# Patient Record
Sex: Female | Born: 1990
Health system: Southern US, Community
[De-identification: ages and names within clinical notes are randomized; demographics above are authoritative.]

## PROBLEM LIST (undated history)

## (undated) ENCOUNTER — Inpatient Hospital Stay (HOSPITAL_COMMUNITY): Payer: Self-pay

## (undated) DIAGNOSIS — F1210 Cannabis abuse, uncomplicated: Secondary | ICD-10-CM

## (undated) DIAGNOSIS — R1110 Vomiting, unspecified: Secondary | ICD-10-CM

## (undated) HISTORY — PX: INDUCED ABORTION: SHX677

---

## 2010-06-25 ENCOUNTER — Emergency Department (HOSPITAL_BASED_OUTPATIENT_CLINIC_OR_DEPARTMENT_OTHER): Admission: EM | Admit: 2010-06-25 | Discharge: 2010-06-25 | Payer: Self-pay | Admitting: Emergency Medicine

## 2011-03-07 LAB — WET PREP, GENITAL: Yeast Wet Prep HPF POC: NONE SEEN

## 2011-03-07 LAB — URINALYSIS, ROUTINE W REFLEX MICROSCOPIC
Ketones, ur: NEGATIVE mg/dL
Nitrite: NEGATIVE
Protein, ur: NEGATIVE mg/dL
Urobilinogen, UA: 0.2 mg/dL (ref 0.0–1.0)
pH: 6.5 (ref 5.0–8.0)

## 2011-03-07 LAB — URINE MICROSCOPIC-ADD ON

## 2011-03-07 LAB — PREGNANCY, URINE: Preg Test, Ur: NEGATIVE

## 2011-03-07 LAB — RPR: RPR Ser Ql: NONREACTIVE

## 2011-03-07 LAB — GC/CHLAMYDIA PROBE AMP, GENITAL: Chlamydia, DNA Probe: POSITIVE — AB

## 2012-10-02 ENCOUNTER — Encounter (HOSPITAL_BASED_OUTPATIENT_CLINIC_OR_DEPARTMENT_OTHER): Payer: Self-pay | Admitting: *Deleted

## 2012-10-02 ENCOUNTER — Emergency Department (HOSPITAL_BASED_OUTPATIENT_CLINIC_OR_DEPARTMENT_OTHER)
Admission: EM | Admit: 2012-10-02 | Discharge: 2012-10-02 | Disposition: A | Discharge status: Home / Self Care | Payer: Medicaid Other | Attending: Emergency Medicine | Admitting: Emergency Medicine

## 2012-10-02 DIAGNOSIS — R10819 Abdominal tenderness, unspecified site: Secondary | ICD-10-CM | POA: Insufficient documentation

## 2012-10-02 DIAGNOSIS — R109 Unspecified abdominal pain: Secondary | ICD-10-CM | POA: Insufficient documentation

## 2012-10-02 DIAGNOSIS — O2390 Unspecified genitourinary tract infection in pregnancy, unspecified trimester: Secondary | ICD-10-CM | POA: Insufficient documentation

## 2012-10-02 DIAGNOSIS — N390 Urinary tract infection, site not specified: Secondary | ICD-10-CM | POA: Insufficient documentation

## 2012-10-02 DIAGNOSIS — O210 Mild hyperemesis gravidarum: Secondary | ICD-10-CM

## 2012-10-02 LAB — URINE MICROSCOPIC-ADD ON

## 2012-10-02 LAB — CBC WITH DIFFERENTIAL/PLATELET
Basophils Absolute: 0 10*3/uL (ref 0.0–0.1)
Eosinophils Relative: 0 % (ref 0–5)
HCT: 42.9 % (ref 36.0–46.0)
Lymphs Abs: 2.4 10*3/uL (ref 0.7–4.0)
MCH: 26.9 pg (ref 26.0–34.0)
MCV: 74.9 fL — ABNORMAL LOW (ref 78.0–100.0)
Monocytes Absolute: 0.6 10*3/uL (ref 0.1–1.0)
Monocytes Relative: 5 % (ref 3–12)
Neutro Abs: 9 10*3/uL — ABNORMAL HIGH (ref 1.7–7.7)
Platelets: 322 10*3/uL (ref 150–400)
RDW: 13.3 % (ref 11.5–15.5)

## 2012-10-02 LAB — BASIC METABOLIC PANEL
BUN: 9 mg/dL (ref 6–23)
CO2: 21 mEq/L (ref 19–32)
Calcium: 10.3 mg/dL (ref 8.4–10.5)
Chloride: 101 mEq/L (ref 96–112)
Creatinine, Ser: 0.6 mg/dL (ref 0.50–1.10)

## 2012-10-02 LAB — URINALYSIS, ROUTINE W REFLEX MICROSCOPIC
Glucose, UA: NEGATIVE mg/dL
Glucose, UA: NEGATIVE mg/dL
Hgb urine dipstick: NEGATIVE
Hgb urine dipstick: NEGATIVE
Ketones, ur: >80 mg/dL — AB
Specific Gravity, Urine: 1.039 — ABNORMAL HIGH (ref 1.005–1.030)
Specific Gravity, Urine: 1.038 — ABNORMAL HIGH (ref 1.005–1.030)
pH: 5.5 (ref 5.0–8.0)
pH: 5.5 (ref 5.0–8.0)

## 2012-10-02 LAB — HCG, QUANTITATIVE, PREGNANCY: hCG, Beta Chain, Quant, S: 102575 m[IU]/mL — ABNORMAL HIGH (ref <5)

## 2012-10-02 LAB — PREGNANCY, URINE: Preg Test, Ur: POSITIVE — AB

## 2012-10-02 MED ORDER — METOCLOPRAMIDE HCL 10 MG PO TABS: 10.0000 mg | ORAL_TABLET | Freq: Four times a day (QID) | ORAL | Status: DC | PRN

## 2012-10-02 MED ORDER — CEPHALEXIN 500 MG PO CAPS: 500.0000 mg | ORAL_CAPSULE | Freq: Three times a day (TID) | ORAL | Status: DC

## 2012-10-02 MED ORDER — CEPHALEXIN 250 MG PO CAPS
500.0000 mg | ORAL_CAPSULE | Freq: Once | ORAL | Status: AC
  Administered 2012-10-02: 500 mg via ORAL
  Filled 2012-10-02: qty 2

## 2012-10-02 MED ORDER — ONDANSETRON HCL 4 MG/2ML IJ SOLN
4.0000 mg | Freq: Once | INTRAMUSCULAR | Status: AC
  Administered 2012-10-02: 4 mg via INTRAVENOUS
  Filled 2012-10-02: qty 2

## 2012-10-02 MED ORDER — DEXTROSE 5 % AND 0.9 % NACL IV BOLUS
1000.0000 mL | Freq: Once | INTRAVENOUS | Status: AC
  Administered 2012-10-02: 1000 mL via INTRAVENOUS

## 2012-10-02 MED ORDER — MORPHINE SULFATE 4 MG/ML IJ SOLN
4.0000 mg | Freq: Once | INTRAMUSCULAR | Status: AC
  Administered 2012-10-02: 4 mg via INTRAVENOUS
  Filled 2012-10-02: qty 1

## 2012-10-02 MED ORDER — DEXTROSE-NACL 5-0.9 % IV SOLN
INTRAVENOUS | Status: DC
  Administered 2012-10-02: 12:00:00 via INTRAVENOUS

## 2012-10-02 NOTE — ED Notes (Signed)
Patient states she developed nausea last tues and has progressed to back pain, crampy abdominal pain, sob, n & v, and weakness.  States this morning she had blood in her vomit and urine.

## 2012-10-02 NOTE — ED Provider Notes (Signed)
History     CSN: [id number]  Arrival date & time 10/02/12  0902   First MD Initiated Contact with Patient 10/02/12 [phone]      Chief Complaint  Patient presents with  . Hematuria  . Abdominal Pain    (Consider location/radiation/quality/duration/timing/severity/associated sxs/prior treatment) Patient is a 21 y.o. female presenting with hematuria and abdominal pain. The history is provided by the patient.  Hematuria Associated symptoms include abdominal pain.  Abdominal Pain The primary symptoms of the illness include abdominal pain.  Additional symptoms associated with the illness include hematuria.  She had onset about 5 days ago of crampy suprapubic pain, nausea, vomiting. Suprapubic pain that radiates to both flanks. She is also having some mild to moderate epigastric pain. She's feeling generally weak. She started having blood in her urine yesterday. She denies fever, chills, sweats. Last menstrual period was September 7. She is gravida 2 para 1 with one voluntary interruption of pregnancy.  Past Medical History  Diagnosis Date  . Preeclampsia complicating hypertension     with CHF pregnancy related, resolved.    Past Surgical History  Procedure Date  . Cesarean section     No family history on file.  History  Substance Use Topics  . Smoking status: Former Smoker    Types: Cigarettes    Quit date: 09/18/2012  . Smokeless tobacco: Not on file  . Alcohol Use: No    OB History    Grav Para Term Preterm Abortions TAB SAB Ect Mult Living                  Review of Systems  Gastrointestinal: Positive for abdominal pain.  Genitourinary: Positive for hematuria.  All other systems reviewed and are negative.    Allergies  Review of patient's allergies indicates no known allergies.  Home Medications  No current outpatient prescriptions on file.  BP 125/85  Pulse 91  Temp 98.7 F (37.1 C) (Oral)  Resp 18  Ht 5\' 5"  (1.651 m)  Wt 175 lb (79.379 kg)  BMI  29.12 kg/m2  SpO2 100%  LMP 08/26/2012  Physical Exam  Nursing note and vitals reviewed. 21year old female, resting comfortably and in no acute distress. Vital signs are normal. Oxygen saturation is 100%, which is normal. Head is normocephalic and atraumatic. PERRLA, EOMI. Oropharynx is clear. Neck is nontender and supple without adenopathy or JVD. Back is nontender and there is no CVA tenderness. Lungs are clear without rales, wheezes, or rhonchi. Chest is nontender. Heart has regular rate and rhythm without murmur. Abdomen is soft, flat, without masses or hepatosplenomegaly and peristalsis is hypoactive. There is mild suprapubic tenderness and mild epigastric tenderness. There is no rebound or guarding. Extremities have no cyanosis or edema, full range of motion is present. Skin is warm and dry without rash. Neurologic: Mental status is normal, cranial nerves are intact, there are no motor or sensory deficits.   ED Course  Procedures (including critical care time)  Results for orders placed during the hospital encounter of 10/02/12  URINALYSIS, ROUTINE W REFLEX MICROSCOPIC      Component Value Range   Color, Urine ORANGE (*) YELLOW   APPearance TURBID (*) CLEAR   Specific Gravity, Urine 1.039 (*) 1.005 - 1.030   pH 5.5  5.0 - 8.0   Glucose, UA NEGATIVE  NEGATIVE mg/dL   Hgb urine dipstick NEGATIVE  NEGATIVE   Bilirubin Urine MODERATE (*) NEGATIVE   Ketones, ur >80 (*) NEGATIVE mg/dL   Protein, ur  100 (*) NEGATIVE mg/dL   Urobilinogen, UA 1.0  0.0 - 1.0 mg/dL   Nitrite NEGATIVE  NEGATIVE   Leukocytes, UA SMALL (*) NEGATIVE  PREGNANCY, URINE      Component Value Range   Preg Test, Ur POSITIVE (*) NEGATIVE  URINE MICROSCOPIC-ADD ON      Component Value Range   Squamous Epithelial / LPF MANY (*) RARE   WBC, UA 11-20  <3 WBC/hpf   RBC / HPF 0-2  <3 RBC/hpf   Bacteria, UA MANY (*) RARE   Casts GRANULAR CAST (*) NEGATIVE   Urine-Other MUCOUS PRESENT    CBC WITH DIFFERENTIAL       Component Value Range   WBC 12.0 (*) 4.0 - 10.5 K/uL   RBC 5.73 (*) 3.87 - 5.11 MIL/uL   Hemoglobin 15.4 (*) 12.0 - 15.0 g/dL   HCT 16.1  09.6 - 04.5 %   MCV 74.9 (*) 78.0 - 100.0 fL   MCH 26.9  26.0 - 34.0 pg   MCHC 35.9  30.0 - 36.0 g/dL   RDW 40.9  81.1 - 91.4 %   Platelets 322  150 - 400 K/uL   Neutrophils Relative 75  43 - 77 %   Lymphocytes Relative 20  12 - 46 %   Monocytes Relative 5  3 - 12 %   Eosinophils Relative 0  0 - 5 %   Basophils Relative 0  0 - 1 %   Neutro Abs 9.0 (*) 1.7 - 7.7 K/uL   Lymphs Abs 2.4  0.7 - 4.0 K/uL   Monocytes Absolute 0.6  0.1 - 1.0 K/uL   Eosinophils Absolute 0.0  0.0 - 0.7 K/uL   Basophils Absolute 0.0  0.0 - 0.1 K/uL   Smear Review MORPHOLOGY UNREMARKABLE    BASIC METABOLIC PANEL      Component Value Range   Sodium 138  135 - 145 mEq/L   Potassium 3.5  3.5 - 5.1 mEq/L   Chloride 101  96 - 112 mEq/L   CO2 21  19 - 32 mEq/L   Glucose, Bld 84  70 - 99 mg/dL   BUN 9  6 - 23 mg/dL   Creatinine, Ser 7.82  0.50 - 1.10 mg/dL   Calcium 95.6  8.4 - 21.3 mg/dL   GFR calc non Af Amer >90  >90 mL/min   GFR calc Af Amer >90  >90 mL/min  HCG, QUANTITATIVE, PREGNANCY      Component Value Range   hCG, Beta Chain, Quant, S 102575 (*) <5 mIU/mL  URINALYSIS, ROUTINE W REFLEX MICROSCOPIC      Component Value Range   Color, Urine ORANGE (*) YELLOW   APPearance CLOUDY (*) CLEAR   Specific Gravity, Urine 1.038 (*) 1.005 - 1.030   pH 5.5  5.0 - 8.0   Glucose, UA NEGATIVE  NEGATIVE mg/dL   Hgb urine dipstick NEGATIVE  NEGATIVE   Bilirubin Urine MODERATE (*) NEGATIVE   Ketones, ur >80 (*) NEGATIVE mg/dL   Protein, ur 30 (*) NEGATIVE mg/dL   Urobilinogen, UA 1.0  0.0 - 1.0 mg/dL   Nitrite NEGATIVE  NEGATIVE   Leukocytes, UA SMALL (*) NEGATIVE  URINE MICROSCOPIC-ADD ON      Component Value Range   Squamous Epithelial / LPF RARE  RARE   WBC, UA 3-6  <3 WBC/hpf   RBC / HPF 3-6  <3 RBC/hpf   Bacteria, UA FEW (*) RARE   Urine-Other MUCOUS PRESENT  1. Hyperemesis gravidarum   2. Urinary tract infection       MDM  Pregnancy test is come back positive, so most symptoms may be related to her pregnancy. Urinalysis shows ketonuria on, so she will be given IV fluid with dextrose and saline. Ondansetron (nausea. The urine sample is contaminated with many squamous epithelial cells, so a catheterized urine will be obtained. Note is made of bacteria and granular casts which is strongly indicative of urinary tract infection this will need to be clarified with a catheterized specimen.  Catheterized urine still shows bacteria although no mention of casts. Urine is sent for culture of and she is started on cephalexin for urinary tract infection. She feels much better after IV hydration and IV ondansetron. She is sent home with prescriptions for cephalexin and metoclopramide. She is instructed to make arrangements for prenatal care.        Dione Booze, MD 10/02/12 1245

## 2012-10-04 LAB — URINE CULTURE: Culture: NO GROWTH

## 2012-12-19 ENCOUNTER — Emergency Department (HOSPITAL_BASED_OUTPATIENT_CLINIC_OR_DEPARTMENT_OTHER)
Admission: EM | Admit: 2012-12-19 | Discharge: 2012-12-19 | Disposition: A | Discharge status: Home / Self Care | Payer: Medicaid Other | Attending: Emergency Medicine | Admitting: Emergency Medicine

## 2012-12-19 ENCOUNTER — Encounter (HOSPITAL_BASED_OUTPATIENT_CLINIC_OR_DEPARTMENT_OTHER): Payer: Self-pay

## 2012-12-19 DIAGNOSIS — O99340 Other mental disorders complicating pregnancy, unspecified trimester: Secondary | ICD-10-CM | POA: Insufficient documentation

## 2012-12-19 DIAGNOSIS — F1210 Cannabis abuse, uncomplicated: Secondary | ICD-10-CM | POA: Insufficient documentation

## 2012-12-19 DIAGNOSIS — K920 Hematemesis: Secondary | ICD-10-CM | POA: Insufficient documentation

## 2012-12-19 DIAGNOSIS — O99891 Other specified diseases and conditions complicating pregnancy: Secondary | ICD-10-CM | POA: Insufficient documentation

## 2012-12-19 DIAGNOSIS — R109 Unspecified abdominal pain: Secondary | ICD-10-CM | POA: Insufficient documentation

## 2012-12-19 DIAGNOSIS — Z87891 Personal history of nicotine dependence: Secondary | ICD-10-CM | POA: Insufficient documentation

## 2012-12-19 DIAGNOSIS — O219 Vomiting of pregnancy, unspecified: Secondary | ICD-10-CM

## 2012-12-19 DIAGNOSIS — B9689 Other specified bacterial agents as the cause of diseases classified elsewhere: Secondary | ICD-10-CM

## 2012-12-19 DIAGNOSIS — N898 Other specified noninflammatory disorders of vagina: Secondary | ICD-10-CM | POA: Insufficient documentation

## 2012-12-19 DIAGNOSIS — N760 Acute vaginitis: Secondary | ICD-10-CM

## 2012-12-19 LAB — URINALYSIS, ROUTINE W REFLEX MICROSCOPIC
Bilirubin Urine: NEGATIVE
Leukocytes, UA: NEGATIVE
Nitrite: NEGATIVE
Specific Gravity, Urine: 1.018 (ref 1.005–1.030)
pH: 8 (ref 5.0–8.0)

## 2012-12-19 LAB — CBC WITH DIFFERENTIAL/PLATELET
Eosinophils Absolute: 0.1 10*3/uL (ref 0.0–0.7)
HCT: 33 % — ABNORMAL LOW (ref 36.0–46.0)
Hemoglobin: 11.4 g/dL — ABNORMAL LOW (ref 12.0–15.0)
Lymphs Abs: 2.3 10*3/uL (ref 0.7–4.0)
MCH: 27.3 pg (ref 26.0–34.0)
MCHC: 34.5 g/dL (ref 30.0–36.0)
MCV: 78.9 fL (ref 78.0–100.0)
Monocytes Absolute: 0.6 10*3/uL (ref 0.1–1.0)
Monocytes Relative: 5 % (ref 3–12)
Neutrophils Relative %: 72 % (ref 43–77)
RBC: 4.18 MIL/uL (ref 3.87–5.11)

## 2012-12-19 LAB — BASIC METABOLIC PANEL
BUN: 3 mg/dL — ABNORMAL LOW (ref 6–23)
Creatinine, Ser: 0.4 mg/dL — ABNORMAL LOW (ref 0.50–1.10)
GFR calc non Af Amer: >90 mL/min (ref >90)
Glucose, Bld: 83 mg/dL (ref 70–99)
Potassium: 3.5 mEq/L (ref 3.5–5.1)

## 2012-12-19 LAB — WET PREP, GENITAL: Yeast Wet Prep HPF POC: NONE SEEN

## 2012-12-19 MED ORDER — ONDANSETRON 8 MG PO TBDP: 8.0000 mg | ORAL_TABLET | Freq: Three times a day (TID) | ORAL | Status: DC | PRN

## 2012-12-19 MED ORDER — SODIUM CHLORIDE 0.9 % IV BOLUS (SEPSIS)
1000.0000 mL | Freq: Once | INTRAVENOUS | Status: AC
  Administered 2012-12-19: 1000 mL via INTRAVENOUS

## 2012-12-19 MED ORDER — METRONIDAZOLE 500 MG PO TABS: 500.0000 mg | ORAL_TABLET | Freq: Two times a day (BID) | ORAL | Status: DC

## 2012-12-19 NOTE — ED Provider Notes (Addendum)
History     CSN: [id number]  Arrival date & time 12/19/12  9562   First MD Initiated Contact with Patient 12/19/12 [phone]      Chief Complaint  Patient presents with  . Abdominal Cramping  . Emesis During Pregnancy  . Hematemesis    (Consider location/radiation/quality/duration/timing/severity/associated sxs/prior treatment) HPI g12p1 lmp 08/24/2012, edc 5/21 here with vomiting.  Patient states she has vomiting since she became pregnant with average of 3-4 times per day.  Patient states she has lost weight with pregnancy.  States she was seen here once.  Dr. Shawnie Pons is ob at [facility].  States was smoking marijuana for nausea but quit two weeks ago.    Past Medical History  Diagnosis Date  . Preeclampsia complicating hypertension     with CHF pregnancy related, resolved.    Past Surgical History  Procedure Date  . Cesarean section     No family history on file.  History  Substance Use Topics  . Smoking status: Former Smoker    Types: Cigarettes    Quit date: 09/18/2012  . Smokeless tobacco: Not on file  . Alcohol Use: No    OB History    Grav Para Term Preterm Abortions TAB SAB Ect Mult Living   1               Review of Systems  Genitourinary: Positive for vaginal discharge. Negative for dysuria, difficulty urinating and dyspareunia.  All other systems reviewed and are negative.    Allergies  Review of patient's allergies indicates no known allergies.  Home Medications  No current outpatient prescriptions on file.  BP 123/64  Pulse 70  Temp 98.5 F (36.9 C) (Oral)  Resp 18  SpO2 100%  LMP 08/24/2012  Physical Exam  Vitals reviewed. Constitutional: She is oriented to person, place, and time. She appears well-developed and well-nourished.  HENT:  Head: Normocephalic and atraumatic.  Eyes: Conjunctivae normal and EOM are normal. Pupils are equal, round, and reactive to light.  Neck: Normal range of motion. Neck supple.  Cardiovascular: Normal rate,  regular rhythm and normal heart sounds.   Pulmonary/Chest: Effort normal and breath sounds normal.  Abdominal: Soft. Bowel sounds are normal. She exhibits no distension. There is no tenderness.  Genitourinary: Uterus normal. Vaginal discharge found.       Uterus enlarge c.w. Dates, mild discharge   Musculoskeletal: Normal range of motion.  Neurological: She is alert and oriented to person, place, and time. She has normal reflexes.  Skin: Skin is warm and dry.  Psychiatric: She has a normal mood and affect. Her behavior is normal. Judgment and thought content normal.    ED Course  Procedures (including critical care time)  Labs Reviewed - No data to display No results found.   No diagnosis found.  Results for orders placed during the hospital encounter of 12/19/12  CBC WITH DIFFERENTIAL      Component Value Range   WBC 10.7 (*) 4.0 - 10.5 K/uL   RBC 4.18  3.87 - 5.11 MIL/uL   Hemoglobin 11.4 (*) 12.0 - 15.0 g/dL   HCT 65.7 (*) 84.6 - 96.2 %   MCV 78.9  78.0 - 100.0 fL   MCH 27.3  26.0 - 34.0 pg   MCHC 34.5  30.0 - 36.0 g/dL   RDW 95.2  84.1 - 32.4 %   Platelets 250  150 - 400 K/uL   Neutrophils Relative 72  43 - 77 %   Neutro Abs 7.8 (*)  1.7 - 7.7 K/uL   Lymphocytes Relative 22  12 - 46 %   Lymphs Abs 2.3  0.7 - 4.0 K/uL   Monocytes Relative 5  3 - 12 %   Monocytes Absolute 0.6  0.1 - 1.0 K/uL   Eosinophils Relative 1  0 - 5 %   Eosinophils Absolute 0.1  0.0 - 0.7 K/uL   Basophils Relative 0  0 - 1 %   Basophils Absolute 0.0  0.0 - 0.1 K/uL  BASIC METABOLIC PANEL      Component Value Range   Sodium 138  135 - 145 mEq/L   Potassium 3.5  3.5 - 5.1 mEq/L   Chloride 106  96 - 112 mEq/L   CO2 21  19 - 32 mEq/L   Glucose, Bld 83  70 - 99 mg/dL   BUN 3 (*) 6 - 23 mg/dL   Creatinine, Ser 1.61 (*) 0.50 - 1.10 mg/dL   Calcium 8.9  8.4 - 09.6 mg/dL   GFR calc non Af Amer >90  >90 mL/min   GFR calc Af Amer >90  >90 mL/min  URINALYSIS, ROUTINE W REFLEX MICROSCOPIC       Component Value Range   Color, Urine YELLOW  YELLOW   APPearance CLOUDY (*) CLEAR   Specific Gravity, Urine 1.018  1.005 - 1.030   pH 8.0  5.0 - 8.0   Glucose, UA NEGATIVE  NEGATIVE mg/dL   Hgb urine dipstick NEGATIVE  NEGATIVE   Bilirubin Urine NEGATIVE  NEGATIVE   Ketones, ur NEGATIVE  NEGATIVE mg/dL   Protein, ur NEGATIVE  NEGATIVE mg/dL   Urobilinogen, UA 0.2  0.0 - 1.0 mg/dL   Nitrite NEGATIVE  NEGATIVE   Leukocytes, UA NEGATIVE  NEGATIVE  WET PREP, GENITAL      Component Value Range   Yeast Wet Prep HPF POC NONE SEEN  NONE SEEN   Trich, Wet Prep NONE SEEN  NONE SEEN   Clue Cells Wet Prep HPF POC TOO NUMEROUS TO COUNT (*) NONE SEEN   WBC, Wet Prep HPF POC FEW (*) NONE SEEN     MDM  Workup consistent with bacterial vaginosis and will treat. Given IV fluids here and is mandatory he has had no vomiting.        Hilario Quarry, MD 12/19/12 0454  Hilario Quarry, MD 12/21/12 [phone]

## 2012-12-19 NOTE — ED Notes (Signed)
Pt reports vomiting dark red blood this am and abdominal cramping.

## 2012-12-21 LAB — GC/CHLAMYDIA PROBE AMP: GC Probe RNA: NEGATIVE

## 2012-12-22 NOTE — ED Notes (Signed)
+   chlamydia Chart sent to EDP office for review. 

## 2012-12-23 NOTE — ED Notes (Signed)
Chart returned from EDP office. Prescribed Azithromycin 500 mg. 1 gram orally x 1 dose. # 2. No refills. Prescribed by Arthor Captain PA-C.

## 2012-12-23 NOTE — ED Notes (Signed)
Rx called in to [facility] on [address] in [place] by [employer] [facility].

## 2013-06-26 ENCOUNTER — Emergency Department (HOSPITAL_BASED_OUTPATIENT_CLINIC_OR_DEPARTMENT_OTHER)
Admission: EM | Admit: 2013-06-26 | Discharge: 2013-06-26 | Disposition: A | Discharge status: Home / Self Care | Payer: Medicaid Other | Attending: Emergency Medicine | Admitting: Emergency Medicine

## 2013-06-26 ENCOUNTER — Emergency Department (HOSPITAL_BASED_OUTPATIENT_CLINIC_OR_DEPARTMENT_OTHER): Payer: Medicaid Other

## 2013-06-26 ENCOUNTER — Encounter (HOSPITAL_BASED_OUTPATIENT_CLINIC_OR_DEPARTMENT_OTHER): Payer: Self-pay | Admitting: Emergency Medicine

## 2013-06-26 DIAGNOSIS — S8391XA Sprain of unspecified site of right knee, initial encounter: Secondary | ICD-10-CM

## 2013-06-26 DIAGNOSIS — Z8742 Personal history of other diseases of the female genital tract: Secondary | ICD-10-CM | POA: Insufficient documentation

## 2013-06-26 DIAGNOSIS — Y92009 Unspecified place in unspecified non-institutional (private) residence as the place of occurrence of the external cause: Secondary | ICD-10-CM | POA: Insufficient documentation

## 2013-06-26 DIAGNOSIS — IMO0002 Reserved for concepts with insufficient information to code with codable children: Secondary | ICD-10-CM | POA: Insufficient documentation

## 2013-06-26 DIAGNOSIS — Y939 Activity, unspecified: Secondary | ICD-10-CM | POA: Insufficient documentation

## 2013-06-26 DIAGNOSIS — Z87891 Personal history of nicotine dependence: Secondary | ICD-10-CM | POA: Insufficient documentation

## 2013-06-26 DIAGNOSIS — W010XXA Fall on same level from slipping, tripping and stumbling without subsequent striking against object, initial encounter: Secondary | ICD-10-CM | POA: Insufficient documentation

## 2013-06-26 NOTE — ED Notes (Signed)
Pt reports falling on wet floor in her bathroom on Sunday, leg went under patient and she heard a popping sound, pt was able to get up off floor by herself, tonight when she went to bed patient noticed a throbbing pain in her right hip

## 2013-06-26 NOTE — ED Provider Notes (Signed)
History    CSN: [id number] Arrival date & time 06/26/13  0234  First MD Initiated Contact with Patient 06/26/13 [phone]     Chief Complaint  Patient presents with  . Leg Pain   (Consider location/radiation/quality/duration/timing/severity/associated sxs/prior Treatment) Patient is a 22 y.o. female presenting with leg pain. The history is provided by the patient.  Leg Pain Location:  Hip and knee Time since incident:  1 day Injury: yes   Mechanism of injury: fall   Fall:    Fall occurred: in her bathroom and slipped on the wet floor and the right leg went in and then twisted out.   Impact surface:  Hard floor   Point of impact:  Knees Hip location:  R hip Knee location:  R knee Pain details:    Quality:  Shooting and throbbing   Radiates to: radiates from the right knee up into the hip tonight.   Severity:  Moderate   Onset quality:  Sudden   Timing:  Constant   Progression:  Worsening Chronicity:  New Prior injury to area:  No Relieved by:  Rest and ice Worsened by:  Bearing weight Associated symptoms: no back pain, no muscle weakness and no numbness   Associated symptoms comment:  When she attempt to walk states it feels like her leg is giving out on her  Past Medical History  Diagnosis Date  . Preeclampsia complicating hypertension     with CHF pregnancy related, resolved.   Past Surgical History  Procedure Laterality Date  . Cesarean section     History reviewed. No pertinent family history. History  Substance Use Topics  . Smoking status: Former Smoker    Types: Cigarettes    Quit date: 09/18/2012  . Smokeless tobacco: Not on file  . Alcohol Use: No   OB History   Grav Para Term Preterm Abortions TAB SAB Ect Mult Living   1              Review of Systems  Musculoskeletal: Negative for back pain.  All other systems reviewed and are negative.    Allergies  Review of patient's allergies indicates no known allergies.  Home Medications   Current  Outpatient Rx  Name  Route  Sig  Dispense  Refill  . NIFEdipine (PROCARDIA XL/ADALAT-CC) 60 MG 24 hr tablet   Oral   Take 30 mg by mouth every 8 (eight) hours.         . Prenatal Vit-Fe Fumarate-FA (PRENATAL MULTIVITAMIN) TABS   Oral   Take 1 tablet by mouth daily at 12 noon.         . metroNIDAZOLE (FLAGYL) 500 MG tablet   Oral   Take 1 tablet (500 mg total) by mouth 2 (two) times daily.   14 tablet   0   . ondansetron (ZOFRAN ODT) 8 MG disintegrating tablet   Oral   Take 1 tablet (8 mg total) by mouth every 8 (eight) hours as needed for nausea.   20 tablet   0    BP 164/94  Pulse 74  Temp(Src) 98.4 F (36.9 C) (Oral)  Resp 18  SpO2 100%  LMP 08/24/2012  Breastfeeding? Unknown Physical Exam  Nursing note and vitals reviewed. Constitutional: She is oriented to person, place, and time. She appears well-developed and well-nourished. No distress.  HENT:  Head: Normocephalic and atraumatic.  Eyes: EOM are normal. Pupils are equal, round, and reactive to light.  Cardiovascular: Normal rate.   Pulmonary/Chest: Effort normal.  Musculoskeletal:       Right hip: She exhibits normal range of motion, normal strength and no deformity.       Right knee: She exhibits decreased range of motion and LCL laxity. She exhibits no swelling, no effusion, no deformity and no MCL laxity. Tenderness found. Lateral joint line and LCL tenderness noted. No medial joint line and no MCL tenderness noted.       Legs: Neurological: She is alert and oriented to person, place, and time.  Skin: Skin is warm and dry.  Psychiatric: She has a normal mood and affect. Her behavior is normal.    ED Course  Procedures (including critical care time) Labs Reviewed - No data to display Dg Hip Complete Right  06/26/2013   *RADIOLOGY REPORT*  Clinical Data: Right hip pain after fall.  RIGHT HIP - COMPLETE 2+ VIEW  Comparison: None.  Findings: The pelvis and right hip appear intact.  No displaced fractures  identified.  No focal bone lesion or bone destruction. Bone cortex and trabecular architecture appear intact.  SI joints, pelvic rim, and symphysis pubis are not displaced.  Suggestion of infiltration in the subcutaneous fat lateral to the right proximal femur which may represent soft tissue hematoma.  IMPRESSION: No acute bony abnormalities demonstrated in the pelvis or right hip.   Original Report Authenticated By: Burman Nieves, M.D.   Dg Knee Complete 4 Views Right  06/26/2013   *RADIOLOGY REPORT*  Clinical Data: Right knee pain after fall.  RIGHT KNEE - COMPLETE 4+ VIEW  Comparison: None.  Findings: The right knee appears intact. No evidence of acute fracture or subluxation.  No focal bone lesions.  Bone matrix and cortex appear intact.  No abnormal radiopaque densities in the soft tissues.  No significant effusion.  IMPRESSION: No acute bony abnormalities demonstrated in the right knee.   Original Report Authenticated By: Burman Nieves, M.D.   1. Knee sprain, right, initial encounter     MDM   Patient with a mechanical polyester name persistent pain in the right knee and hip. Pain is mostly localized to critical lateral collateral ligament of the right knee and patient feels that her knee is giving out on her when she attempts to walk. She is able to limp but walk normally. Mild ligamental laxity in the LCL. No ankle pain. Hip and knee films pending.  Gwyneth Sprout, MD 06/26/13 [phone]

## 2014-10-21 ENCOUNTER — Encounter (HOSPITAL_BASED_OUTPATIENT_CLINIC_OR_DEPARTMENT_OTHER): Payer: Self-pay | Admitting: Emergency Medicine

## 2014-12-11 ENCOUNTER — Encounter (HOSPITAL_BASED_OUTPATIENT_CLINIC_OR_DEPARTMENT_OTHER): Payer: Self-pay | Admitting: *Deleted

## 2014-12-11 ENCOUNTER — Emergency Department (HOSPITAL_BASED_OUTPATIENT_CLINIC_OR_DEPARTMENT_OTHER): Payer: Medicaid Other

## 2014-12-11 ENCOUNTER — Emergency Department (HOSPITAL_BASED_OUTPATIENT_CLINIC_OR_DEPARTMENT_OTHER)
Admission: EM | Admit: 2014-12-11 | Discharge: 2014-12-11 | Disposition: A | Discharge status: Home / Self Care | Payer: Medicaid Other | Attending: Emergency Medicine | Admitting: Emergency Medicine

## 2014-12-11 DIAGNOSIS — S39012A Strain of muscle, fascia and tendon of lower back, initial encounter: Secondary | ICD-10-CM

## 2014-12-11 DIAGNOSIS — Z720 Tobacco use: Secondary | ICD-10-CM | POA: Insufficient documentation

## 2014-12-11 DIAGNOSIS — S161XXA Strain of muscle, fascia and tendon at neck level, initial encounter: Secondary | ICD-10-CM | POA: Insufficient documentation

## 2014-12-11 DIAGNOSIS — Y9389 Activity, other specified: Secondary | ICD-10-CM | POA: Insufficient documentation

## 2014-12-11 DIAGNOSIS — S29012A Strain of muscle and tendon of back wall of thorax, initial encounter: Secondary | ICD-10-CM | POA: Diagnosis not present

## 2014-12-11 DIAGNOSIS — Y92410 Unspecified street and highway as the place of occurrence of the external cause: Secondary | ICD-10-CM | POA: Insufficient documentation

## 2014-12-11 DIAGNOSIS — S199XXA Unspecified injury of neck, initial encounter: Secondary | ICD-10-CM | POA: Diagnosis present

## 2014-12-11 DIAGNOSIS — Y998 Other external cause status: Secondary | ICD-10-CM | POA: Insufficient documentation

## 2014-12-11 MED ORDER — CYCLOBENZAPRINE HCL 5 MG PO TABS: 5.0000 mg | ORAL_TABLET | Freq: Three times a day (TID) | ORAL | Status: DC | PRN

## 2014-12-11 MED ORDER — CYCLOBENZAPRINE HCL 10 MG PO TABS
5.0000 mg | ORAL_TABLET | Freq: Once | ORAL | Status: AC
  Administered 2014-12-11: 5 mg via ORAL
  Filled 2014-12-11: qty 1

## 2014-12-11 MED ORDER — IBUPROFEN 400 MG PO TABS
600.0000 mg | ORAL_TABLET | Freq: Once | ORAL | Status: AC
  Administered 2014-12-11: 600 mg via ORAL
  Filled 2014-12-11 (×2): qty 1

## 2014-12-11 MED ORDER — IBUPROFEN 600 MG PO TABS: 600.0000 mg | ORAL_TABLET | Freq: Three times a day (TID) | ORAL | Status: DC | PRN
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
## 2014-12-11 NOTE — ED Notes (Signed)
Patient transported to X-ray 
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
## 2015-02-10 ENCOUNTER — Encounter (HOSPITAL_BASED_OUTPATIENT_CLINIC_OR_DEPARTMENT_OTHER): Payer: Self-pay | Admitting: *Deleted

## 2015-02-10 ENCOUNTER — Emergency Department (HOSPITAL_BASED_OUTPATIENT_CLINIC_OR_DEPARTMENT_OTHER): Payer: Medicaid Other

## 2015-02-10 ENCOUNTER — Emergency Department (HOSPITAL_BASED_OUTPATIENT_CLINIC_OR_DEPARTMENT_OTHER)
Admission: EM | Admit: 2015-02-10 | Discharge: 2015-02-10 | Disposition: A | Discharge status: Home / Self Care | Payer: Medicaid Other | Attending: Emergency Medicine | Admitting: Emergency Medicine

## 2015-02-10 DIAGNOSIS — A084 Viral intestinal infection, unspecified: Secondary | ICD-10-CM | POA: Insufficient documentation

## 2015-02-10 DIAGNOSIS — D72829 Elevated white blood cell count, unspecified: Secondary | ICD-10-CM | POA: Insufficient documentation

## 2015-02-10 DIAGNOSIS — Z720 Tobacco use: Secondary | ICD-10-CM | POA: Insufficient documentation

## 2015-02-10 DIAGNOSIS — Z3202 Encounter for pregnancy test, result negative: Secondary | ICD-10-CM | POA: Diagnosis not present

## 2015-02-10 DIAGNOSIS — R1084 Generalized abdominal pain: Secondary | ICD-10-CM | POA: Diagnosis present

## 2015-02-10 DIAGNOSIS — N390 Urinary tract infection, site not specified: Secondary | ICD-10-CM | POA: Diagnosis not present

## 2015-02-10 DIAGNOSIS — R319 Hematuria, unspecified: Secondary | ICD-10-CM

## 2015-02-10 LAB — CBC WITH DIFFERENTIAL/PLATELET
BASOS ABS: 0 10*3/uL (ref 0.0–0.1)
Basophils Relative: 0 % (ref 0–1)
EOS ABS: 0 10*3/uL (ref 0.0–0.7)
EOS PCT: 0 % (ref 0–5)
HEMATOCRIT: 40.8 % (ref 36.0–46.0)
Hemoglobin: 14.1 g/dL (ref 12.0–15.0)
LYMPHS PCT: 3 % — AB (ref 12–46)
Lymphs Abs: 0.6 10*3/uL — ABNORMAL LOW (ref 0.7–4.0)
MCH: 27 pg (ref 26.0–34.0)
MCHC: 34.6 g/dL (ref 30.0–36.0)
MCV: 78 fL (ref 78.0–100.0)
Monocytes Absolute: 0.7 10*3/uL (ref 0.1–1.0)
Monocytes Relative: 3 % (ref 3–12)
NEUTROS ABS: 22.8 10*3/uL — AB (ref 1.7–7.7)
Neutrophils Relative %: 94 % — ABNORMAL HIGH (ref 43–77)
Platelets: 263 10*3/uL (ref 150–400)
RBC: 5.23 MIL/uL — AB (ref 3.87–5.11)
RDW: 13 % (ref 11.5–15.5)
WBC: 24.1 10*3/uL — ABNORMAL HIGH (ref 4.0–10.5)

## 2015-02-10 LAB — COMPREHENSIVE METABOLIC PANEL
ALK PHOS: 52 U/L (ref 39–117)
ALT: 15 U/L (ref 0–35)
AST: 24 U/L (ref 0–37)
Albumin: 4.6 g/dL (ref 3.5–5.2)
Anion gap: 8 (ref 5–15)
BILIRUBIN TOTAL: 1.2 mg/dL (ref 0.3–1.2)
BUN: 19 mg/dL (ref 6–23)
CALCIUM: 9.2 mg/dL (ref 8.4–10.5)
CHLORIDE: 105 mmol/L (ref 96–112)
CO2: 20 mmol/L (ref 19–32)
Creatinine, Ser: 1.12 mg/dL — ABNORMAL HIGH (ref 0.50–1.10)
GFR calc Af Amer: 78 mL/min — ABNORMAL LOW (ref >90)
GFR, EST NON AFRICAN AMERICAN: 67 mL/min — AB (ref >90)
Glucose, Bld: 150 mg/dL — ABNORMAL HIGH (ref 70–99)
POTASSIUM: 3.4 mmol/L — AB (ref 3.5–5.1)
SODIUM: 133 mmol/L — AB (ref 135–145)
TOTAL PROTEIN: 9 g/dL — AB (ref 6.0–8.3)

## 2015-02-10 LAB — URINE MICROSCOPIC-ADD ON

## 2015-02-10 LAB — URINALYSIS, ROUTINE W REFLEX MICROSCOPIC
BILIRUBIN URINE: NEGATIVE
GLUCOSE, UA: 100 mg/dL — AB
Ketones, ur: 15 mg/dL — AB
NITRITE: NEGATIVE
Protein, ur: 100 mg/dL — AB
Specific Gravity, Urine: 1.025 (ref 1.005–1.030)
Urobilinogen, UA: 0.2 mg/dL (ref 0.0–1.0)
pH: 6 (ref 5.0–8.0)

## 2015-02-10 LAB — LIPASE, BLOOD: Lipase: 20 U/L (ref 11–59)

## 2015-02-10 LAB — PREGNANCY, URINE: Preg Test, Ur: NEGATIVE

## 2015-02-10 MED ORDER — DICYCLOMINE HCL 10 MG PO CAPS
20.0000 mg | ORAL_CAPSULE | Freq: Once | ORAL | Status: AC
  Administered 2015-02-10: 20 mg via ORAL

## 2015-02-10 MED ORDER — CEFTRIAXONE SODIUM 1 G IJ SOLR
INTRAMUSCULAR | Status: AC
Start: 2015-02-10 — End: 2015-02-10
  Filled 2015-02-10: qty 10

## 2015-02-10 MED ORDER — LOPERAMIDE HCL 2 MG PO CAPS: 2.0000 mg | ORAL_CAPSULE | Freq: Four times a day (QID) | ORAL | Status: DC | PRN

## 2015-02-10 MED ORDER — DICYCLOMINE HCL 10 MG PO CAPS
ORAL_CAPSULE | ORAL | Status: AC
  Filled 2015-02-10: qty 1

## 2015-02-10 MED ORDER — IOHEXOL 300 MG/ML  SOLN
100.0000 mL | Freq: Once | INTRAMUSCULAR | Status: AC | PRN
  Administered 2015-02-10: 100 mL via INTRAVENOUS

## 2015-02-10 MED ORDER — SODIUM CHLORIDE 0.9 % IV BOLUS (SEPSIS)
1000.0000 mL | Freq: Once | INTRAVENOUS | Status: AC
  Administered 2015-02-10: 1000 mL via INTRAVENOUS

## 2015-02-10 MED ORDER — KETOROLAC TROMETHAMINE 30 MG/ML IJ SOLN
30.0000 mg | Freq: Once | INTRAMUSCULAR | Status: AC
  Administered 2015-02-10: 30 mg via INTRAVENOUS
  Filled 2015-02-10: qty 1

## 2015-02-10 MED ORDER — CEPHALEXIN 500 MG PO CAPS: 500.0000 mg | ORAL_CAPSULE | Freq: Two times a day (BID) | ORAL | Status: DC

## 2015-02-10 MED ORDER — DEXTROSE 5 % IV SOLN
1.0000 g | Freq: Once | INTRAVENOUS | Status: AC
  Administered 2015-02-10: 1 g via INTRAVENOUS

## 2015-02-10 MED ORDER — TRAMADOL HCL 50 MG PO TABS: 50.0000 mg | ORAL_TABLET | Freq: Four times a day (QID) | ORAL | Status: DC | PRN

## 2015-02-10 MED ORDER — DICYCLOMINE HCL 10 MG PO CAPS
ORAL_CAPSULE | ORAL | Status: AC
  Administered 2015-02-10: 20 mg via ORAL
  Filled 2015-02-10: qty 1

## 2015-02-10 MED ORDER — IOHEXOL 300 MG/ML  SOLN
50.0000 mL | Freq: Once | INTRAMUSCULAR | Status: AC | PRN
  Administered 2015-02-10: 50 mL via ORAL

## 2015-02-10 MED ORDER — ONDANSETRON 4 MG PO TBDP: 4.0000 mg | ORAL_TABLET | Freq: Three times a day (TID) | ORAL | Status: DC | PRN

## 2015-02-10 MED ORDER — ONDANSETRON HCL 4 MG/2ML IJ SOLN
4.0000 mg | Freq: Once | INTRAMUSCULAR | Status: AC
  Administered 2015-02-10: 4 mg via INTRAVENOUS
  Filled 2015-02-10: qty 2

## 2015-02-10 NOTE — Discharge Instructions (Signed)
Urinary Tract Infection °Urinary tract infections (UTIs) can develop anywhere along your urinary tract. Your urinary tract is your body's drainage system for removing wastes and extra water. Your urinary tract includes two kidneys, two ureters, a bladder, and a urethra. Your kidneys are a pair of bean-shaped organs. Each kidney is about the size of your fist. They are located below your ribs, one on each side of your spine. °CAUSES °Infections are caused by microbes, which are microscopic organisms, including fungi, viruses, and bacteria. These organisms are so small that they can only be seen through a microscope. Bacteria are the microbes that most commonly cause UTIs. °SYMPTOMS  °Symptoms of UTIs may vary by age and gender of the patient and by the location of the infection. Symptoms in young women typically include a frequent and intense urge to urinate and a painful, burning feeling in the bladder or urethra during urination. Older women and men are more likely to be tired, shaky, and weak and have muscle aches and abdominal pain. A fever may mean the infection is in your kidneys. Other symptoms of a kidney infection include pain in your back or sides below the ribs, nausea, and vomiting. °DIAGNOSIS °To diagnose a UTI, your caregiver will ask you about your symptoms. Your caregiver also will ask to provide a urine sample. The urine sample will be tested for bacteria and white blood cells. White blood cells are made by your body to help fight infection. °TREATMENT  °Typically, UTIs can be treated with medication. Because most UTIs are caused by a bacterial infection, they usually can be treated with the use of antibiotics. The choice of antibiotic and length of treatment depend on your symptoms and the type of bacteria causing your infection. °HOME CARE INSTRUCTIONS °· If you were prescribed antibiotics, take them exactly as your caregiver instructs you. Finish the medication even if you feel better after you  have only taken some of the medication. °· Drink enough water and fluids to keep your urine clear or pale yellow. °· Avoid caffeine, tea, and carbonated beverages. They tend to irritate your bladder. °· Empty your bladder often. Avoid holding urine for long periods of time. °· Empty your bladder before and after sexual intercourse. °· After a bowel movement, women should cleanse from front to back. Use each tissue only once. °SEEK MEDICAL CARE IF:  °· You have back pain. °· You develop a fever. °· Your symptoms do not begin to resolve within 3 days. °SEEK IMMEDIATE MEDICAL CARE IF:  °· You have severe back pain or lower abdominal pain. °· You develop chills. °· You have nausea or vomiting. °· You have continued burning or discomfort with urination. °MAKE SURE YOU:  °· Understand these instructions. °· Will watch your condition. °· Will get help right away if you are not doing well or get worse. °Document Released: 09/15/2005 Document Revised: 06/06/2012 Document Reviewed: 01/14/2012 °ExitCare® Patient Information ©2015 [employer]. This information is not intended to replace advice given to you by your health care provider. Make sure you discuss any questions you have with your health care provider. °Viral Gastroenteritis °Viral gastroenteritis is also known as stomach flu. This condition affects the stomach and intestinal tract. It can cause sudden diarrhea and vomiting. The illness typically lasts 3 to 8 days. Most people develop an immune response that eventually gets rid of the virus. While this natural response develops, the virus can make you quite ill. °CAUSES  °Many different viruses can cause gastroenteritis, such   as rotavirus or noroviruses. You can catch one of these viruses by consuming contaminated food or water. You may also catch a virus by sharing utensils or other personal items with an infected person or by touching a contaminated surface. °SYMPTOMS  °The most common symptoms are diarrhea and  vomiting. These problems can cause a severe loss of body fluids (dehydration) and a body salt (electrolyte) imbalance. Other symptoms may include: °· Fever. °· Headache. °· Fatigue. °· Abdominal pain. °DIAGNOSIS  °Your caregiver can usually diagnose viral gastroenteritis based on your symptoms and a physical exam. A stool sample may also be taken to test for the presence of viruses or other infections. °TREATMENT  °This illness typically goes away on its own. Treatments are aimed at rehydration. The most serious cases of viral gastroenteritis involve vomiting so severely that you are not able to keep fluids down. In these cases, fluids must be given through an intravenous line (IV). °HOME CARE INSTRUCTIONS  °· Drink enough fluids to keep your urine clear or pale yellow. Drink small amounts of fluids frequently and increase the amounts as tolerated. °· Ask your caregiver for specific rehydration instructions. °· Avoid: °· Foods high in sugar. °· Alcohol. °· Carbonated drinks. °· Tobacco. °· Juice. °· Caffeine drinks. °· Extremely hot or cold fluids. °· Fatty, greasy foods. °· Too much intake of anything at one time. °· Dairy products until 24 to 48 hours after diarrhea stops. °· You may consume probiotics. Probiotics are active cultures of beneficial bacteria. They may lessen the amount and number of diarrheal stools in adults. Probiotics can be found in yogurt with active cultures and in supplements. °· Wash your hands well to avoid spreading the virus. °· Only take over-the-counter or prescription medicines for pain, discomfort, or fever as directed by your caregiver. Do not give aspirin to children. Antidiarrheal medicines are not recommended. °· Ask your caregiver if you should continue to take your regular prescribed and over-the-counter medicines. °· Keep all follow-up appointments as directed by your caregiver. °SEEK IMMEDIATE MEDICAL CARE IF:  °· You are unable to keep fluids down. °· You do not urinate at  least once every 6 to 8 hours. °· You develop shortness of breath. °· You notice blood in your stool or vomit. This may look like coffee grounds. °· You have abdominal pain that increases or is concentrated in one small area (localized). °· You have persistent vomiting or diarrhea. °· You have a fever. °· The patient is a child younger than 3 months, and he or she has a fever. °· The patient is a child older than 3 months, and he or she has a fever and persistent symptoms. °· The patient is a child older than 3 months, and he or she has a fever and symptoms suddenly get worse. °· The patient is a baby, and he or she has no tears when crying. °MAKE SURE YOU:  °· Understand these instructions. °· Will watch your condition. °· Will get help right away if you are not doing well or get worse. °Document Released: 12/06/2005 Document Revised: 02/28/2012 Document Reviewed: 09/22/2011 °ExitCare® Patient Information ©2015 [employer]. This information is not intended to replace advice given to you by your health care provider. Make sure you discuss any questions you have with your health care provider. ° °

## 2015-02-10 NOTE — ED Notes (Signed)
Iv fluid bolus infusing po fluid challenge held untill after abd CT scan obtained

## 2015-02-10 NOTE — ED Notes (Signed)
C/o general abd pain that started 24 hours ago. States lower pain bilateral is worse. Describes as cramping and sharp. C/o chills. Fevers unknown. States she has also had v/d. LMP 01-28-15.

## 2015-02-10 NOTE — ED Provider Notes (Signed)
TIME SEEN: 2:00 AM  CHIEF COMPLAINT: Abdominal pain  HPI: Pt is a 24 y.o. female who presents emergency department with subjective fevers, chills, nausea, vomiting, diarrhea and diffuse abdominal pain the torso lower abdomen that started 24 hours ago. Denies sick contacts or recent travel. Denies dysuria or hematuria, vaginal bleeding or discharge, bloody stool or melena. Last menstrual period was 01/28/15. She is status post 2 prior C-sections.  ROS: See HPI Constitutional: no fever  Eyes: no drainage  ENT: no runny nose   Cardiovascular:  no chest pain  Resp: no SOB  GI: vomiting GU: no dysuria Integumentary: no rash  Allergy: no hives  Musculoskeletal: no leg swelling  Neurological: no slurred speech ROS otherwise negative  PAST MEDICAL HISTORY/PAST SURGICAL HISTORY:  History reviewed. No pertinent past medical history.  MEDICATIONS:  Prior to Admission medications   Medication Sig Start Date End Date Taking? Authorizing Provider  cyclobenzaprine (FLEXERIL) 5 MG tablet Take 1 tablet (5 mg total) by mouth 3 (three) times daily as needed for muscle spasms. 12/11/14   Audree CamelScott T Goldston, MD  ibuprofen (ADVIL,MOTRIN) 600 MG tablet Take 1 tablet (600 mg total) by mouth every 8 (eight) hours as needed. 12/11/14   Audree CamelScott T Goldston, MD  ondansetron (ZOFRAN ODT) 8 MG disintegrating tablet Take 1 tablet (8 mg total) by mouth every 8 (eight) hours as needed for nausea. 12/19/12   Hilario Quarryanielle S Ray, MD    ALLERGIES:  No Known Allergies  SOCIAL HISTORY:  History  Substance Use Topics  . Smoking status: Current Every Day Smoker    Types: Cigarettes  . Smokeless tobacco: Not on file  . Alcohol Use: No    FAMILY HISTORY: No family history on file.  EXAM: BP 126/63 mmHg  Temp(Src) 98 F (36.7 C) (Oral)  Resp 20  Ht 5\' 5"  (1.651 m)  Wt 147 lb (66.679 kg)  BMI 24.46 kg/m2  SpO2 100%  LMP 01/28/2015 (Approximate) CONSTITUTIONAL: Alert and oriented and responds appropriately to  questions. Well-appearing; well-nourished HEAD: Normocephalic EYES: Conjunctivae clear, PERRL ENT: normal nose; no rhinorrhea; moist mucous membranes; pharynx without lesions noted NECK: Supple, no meningismus, no LAD  CARD: RRR; S1 and S2 appreciated; no murmurs, no clicks, no rubs, no gallops RESP: Normal chest excursion without splinting or tachypnea; breath sounds clear and equal bilaterally; no wheezes, no rhonchi, no rales,  ABD/GI: Normal bowel sounds; non-distended; soft, diffusely tender throughout the abdomen without guarding or rebound, no peritoneal signs BACK:  The back appears normal and is non-tender to palpation, there is no CVA tenderness EXT: Normal ROM in all joints; non-tender to palpation; no edema; normal capillary refill; no cyanosis    SKIN: Normal color for age and race; warm NEURO: Moves all extremities equally PSYCH: The patient's mood and manner are appropriate. Grooming and personal hygiene are appropriate.  MEDICAL DECISION MAKING: Patient here with abdominal pain, nausea, vomiting diarrhea. Afebrile in the emergency department and nontoxic appearing. She is tender to palpation throughout her abdomen. This may be a viral illness. Will check labs, urine. Will give IV fluids, pain and nausea medicine.  ED PROGRESS: Labs show leukocytosis of 24.1 with left shift. She also has mild elevation of her creatinine at 1.12. We'll give second liter IV fluids. Otherwise LFTs and lipase are normal. Urine does show moderate hemoglobin, small leukocytes and bacteria. Culture pending. We'll give Rocephin in the ED. Pregnancy test negative. Given she is diffusely tender and has a significant leukocytosis will obtain a CT scan  for further evaluation for possible intra-abdominal pathology.  4:45 AM  Pt reports feeling better. Able to tolerate by mouth.  CT scan shows acute enteritis. Normal appendix. No other acute intra-abdominal or pelvic pathology. We'll discharge with Keflex for her  UTI. Has received Rocephin in the ED. Will also discharge with antibiotics and antidiarrheal agents. We'll discharge with prescription for tramadol for pain. Discussed return precautions. She verbalized understanding and is comfortable with plan.   Layla Maw Zareya Tuckett, DO 02/10/15 [phone]

## 2015-02-11 LAB — URINE CULTURE: Colony Count: 80000

## 2015-11-17 ENCOUNTER — Encounter (HOSPITAL_BASED_OUTPATIENT_CLINIC_OR_DEPARTMENT_OTHER): Payer: Self-pay | Admitting: *Deleted

## 2015-11-17 ENCOUNTER — Emergency Department (HOSPITAL_BASED_OUTPATIENT_CLINIC_OR_DEPARTMENT_OTHER)
Admission: EM | Admit: 2015-11-17 | Discharge: 2015-11-17 | Disposition: A | Discharge status: Home / Self Care | Payer: Medicaid Other | Attending: Emergency Medicine | Admitting: Emergency Medicine

## 2015-11-17 DIAGNOSIS — F17210 Nicotine dependence, cigarettes, uncomplicated: Secondary | ICD-10-CM | POA: Diagnosis not present

## 2015-11-17 DIAGNOSIS — B360 Pityriasis versicolor: Secondary | ICD-10-CM | POA: Diagnosis not present

## 2015-11-17 DIAGNOSIS — R21 Rash and other nonspecific skin eruption: Secondary | ICD-10-CM | POA: Diagnosis present

## 2015-11-17 MED ORDER — KETOCONAZOLE 2 % EX CREA: 1.0000 "application " | TOPICAL_CREAM | Freq: Every day | CUTANEOUS | Status: DC

## 2015-11-17 NOTE — ED Notes (Signed)
Patient stable and ambulatory.  Patient verbalizes understanding of discharge medications, instructions and follow-up. 

## 2015-11-17 NOTE — Discharge Instructions (Signed)
Tinea Versicolor Tinea versicolor is a common fungal infection of the skin. It causes a rash that appears as light or dark patches on the skin. The rash most often occurs on the chest, back, neck, or upper arms. This condition is more common during warm weather. Other than affecting how your skin looks, tinea versicolor usually does not cause other problems. In most cases, the infection goes away in a few weeks with treatment. It may take a few months for the patches on your skin to clear up. CAUSES Tinea versicolor occurs when a type of fungus that is normally present on the skin starts to overgrow. This fungus is a kind of yeast. The exact cause of the overgrowth is not known. This condition cannot be passed from one person to another (noncontagious). RISK FACTORS This condition is more likely to develop when certain factors are present, such as:  Heat and humidity.  Sweating too much.  Hormone changes.  Oily skin.  A weak defense (immune) system. SYMPTOMS Symptoms of this condition may include:  A rash on your skin that is made up of light or dark patches. The rash may have:  Patches of tan or pink spots on light skin.  Patches of white or brown spots on dark skin.  Patches of skin that do not tan.  Well-marked edges.  Scales on the discolored areas.  Mild itching. DIAGNOSIS A health care provider can usually diagnose this condition by looking at your skin. During the exam, he or she may use ultraviolet light to help determine the extent of the infection. In some cases, a skin sample may be taken by scraping the rash. This sample will be viewed under a microscope to check for yeast overgrowth. TREATMENT Treatment for this condition may include:  Dandruff shampoo that is applied to the affected skin during showers or bathing.  Over-the-counter medicated skin cream, lotion, or soaps.  Prescription antifungal medicine in the form of skin cream or pills.  Medicine to help  reduce itching. HOME CARE INSTRUCTIONS  Take medicines only as directed by your health care provider.  Apply dandruff shampoo to the affected area if told to do so by your health care provider. You may be instructed to scrub the affected skin for several minutes each day.  Do not scratch the affected area of skin.  Avoid hot and humid conditions.  Do not use tanning booths.  Try to avoid sweating a lot. SEEK MEDICAL CARE IF:  Your symptoms get worse.  You have a fever.  You have redness, swelling, or pain at the site of your rash.  You have fluid, blood, or pus coming from your rash.  Your rash returns after treatment.   This information is not intended to replace advice given to you by your health care provider. Make sure you discuss any questions you have with your health care provider.   Document Released: 12/03/2000 Document Revised: 12/27/2014 Document Reviewed: 09/17/2014 Elsevier Interactive Patient Education 2016 [employer]  

## 2015-11-17 NOTE — ED Provider Notes (Signed)
CSN: [id number]     Arrival date & time 11/17/15  [id number] History   First MD Initiated Contact with Patient 11/17/15 [phone]     Chief Complaint  Patient presents with  . Rash     (Consider location/radiation/quality/duration/timing/severity/associated sxs/prior Treatment) Patient is a 24 y.o. female presenting with rash. The history is provided by the patient. No language interpreter was used.  Rash Location:  Full body Quality: dryness, itchiness and scaling   Quality: not blistering, not bruising, not burning, not draining, not painful, not peeling, not red and not weeping   Severity:  Mild Onset quality:  Gradual Timing:  Intermittent Progression:  Unchanged Chronicity:  Recurrent Context: exposure to similar rash   Relieved by:  Anti-fungal cream   History reviewed. No pertinent past medical history. Past Surgical History  Procedure Laterality Date  . Cesarean section     History reviewed. No pertinent family history. Social History  Substance Use Topics  . Smoking status: Current Every Day Smoker    Types: Cigarettes  . Smokeless tobacco: None  . Alcohol Use: No   OB History    Gravida Para Term Preterm AB TAB SAB Ectopic Multiple Living   1              Review of Systems  Skin: Positive for rash.  All other systems reviewed and are negative.     Allergies  Review of patient's allergies indicates no known allergies.  Home Medications   Prior to Admission medications   Medication Sig Start Date End Date Taking? Authorizing Provider  ketoconazole (NIZORAL) 2 % cream Apply 1 application topically daily. 11/17/15   Roxy Horsemanobert Rawad Bochicchio, PA-C   BP 144/97 mmHg  Pulse 69  Temp(Src) 98.3 F (36.8 C) (Oral)  Resp 20  Ht 5\' 5"  (1.651 m)  Wt 70.308 kg  BMI 25.79 kg/m2  SpO2 100%  LMP 11/07/2015 Physical Exam  Constitutional: She is oriented to person, place, and time. She appears well-developed and well-nourished.  HENT:  Head: Normocephalic and atraumatic.   Eyes: Conjunctivae and EOM are normal.  Neck: Normal range of motion.  Cardiovascular: Normal rate.   Pulmonary/Chest: Effort normal.  Abdominal: She exhibits no distension.  Musculoskeletal: Normal range of motion.  Neurological: She is alert and oriented to person, place, and time.  Skin: Skin is dry.  Mild scaling circular lesions consistent with tinea  Psychiatric: She has a normal mood and affect. Her behavior is normal. Judgment and thought content normal.  Nursing note and vitals reviewed.   ED Course  Procedures (including critical care time)   MDM   Final diagnoses:  Tinea versicolor    Skin lesions consistent with tinea versicolor.  Patient's kids have the same.  Will treat with antifungal cream.  PCP follow-up.    Roxy Horsemanobert Jeovanni Heuring, PA-C 11/17/15 [phone]  Melene Planan Floyd, DO 11/17/15 [phone]

## 2015-11-17 NOTE — ED Notes (Signed)
Pt reports "bumps" x 2 months, seen by her pcp and dx with ringworm and finished creams, cont with "bumps".

## 2015-12-22 ENCOUNTER — Emergency Department (HOSPITAL_BASED_OUTPATIENT_CLINIC_OR_DEPARTMENT_OTHER): Payer: Medicaid Other

## 2015-12-22 ENCOUNTER — Emergency Department (HOSPITAL_BASED_OUTPATIENT_CLINIC_OR_DEPARTMENT_OTHER)
Admission: EM | Admit: 2015-12-22 | Discharge: 2015-12-22 | Disposition: A | Discharge status: Home / Self Care | Payer: Medicaid Other | Attending: Emergency Medicine | Admitting: Emergency Medicine

## 2015-12-22 ENCOUNTER — Encounter (HOSPITAL_BASED_OUTPATIENT_CLINIC_OR_DEPARTMENT_OTHER): Payer: Self-pay | Admitting: Emergency Medicine

## 2015-12-22 DIAGNOSIS — F17210 Nicotine dependence, cigarettes, uncomplicated: Secondary | ICD-10-CM | POA: Diagnosis not present

## 2015-12-22 DIAGNOSIS — Z79899 Other long term (current) drug therapy: Secondary | ICD-10-CM | POA: Insufficient documentation

## 2015-12-22 DIAGNOSIS — B9689 Other specified bacterial agents as the cause of diseases classified elsewhere: Secondary | ICD-10-CM

## 2015-12-22 DIAGNOSIS — N760 Acute vaginitis: Secondary | ICD-10-CM | POA: Insufficient documentation

## 2015-12-22 DIAGNOSIS — O209 Hemorrhage in early pregnancy, unspecified: Secondary | ICD-10-CM

## 2015-12-22 DIAGNOSIS — R21 Rash and other nonspecific skin eruption: Secondary | ICD-10-CM | POA: Insufficient documentation

## 2015-12-22 DIAGNOSIS — Z7952 Long term (current) use of systemic steroids: Secondary | ICD-10-CM | POA: Insufficient documentation

## 2015-12-22 DIAGNOSIS — Z331 Pregnant state, incidental: Secondary | ICD-10-CM | POA: Insufficient documentation

## 2015-12-22 DIAGNOSIS — Z3490 Encounter for supervision of normal pregnancy, unspecified, unspecified trimester: Secondary | ICD-10-CM

## 2015-12-22 LAB — WET PREP, GENITAL
Sperm: NONE SEEN
Trich, Wet Prep: NONE SEEN
YEAST WET PREP: NONE SEEN

## 2015-12-22 LAB — ABO/RH: ABO/RH(D): O POS

## 2015-12-22 LAB — HCG, QUANTITATIVE, PREGNANCY: HCG, BETA CHAIN, QUANT, S: 32072 m[IU]/mL — AB (ref <5)

## 2015-12-22 LAB — PREGNANCY, URINE: Preg Test, Ur: POSITIVE — AB

## 2015-12-22 MED ORDER — TRIAMCINOLONE ACETONIDE 0.025 % EX OINT: 1.0000 "application " | TOPICAL_OINTMENT | Freq: Two times a day (BID) | CUTANEOUS | Status: DC

## 2015-12-22 MED ORDER — METRONIDAZOLE 500 MG PO TABS: 500.0000 mg | ORAL_TABLET | Freq: Two times a day (BID) | ORAL | Status: DC

## 2015-12-22 MED FILL — DESONIDE 0.05% OINTMENT: 0.05 | 7 days supply | Qty: 30 | Fill #0

## 2015-12-22 MED FILL — metroNIDAZOLE 500 MG TABS: 500 | 7 days supply | Qty: 14 | Fill #0

## 2015-12-22 NOTE — ED Notes (Signed)
Pt states that she has a new boyfriend she started seeing in November, she had no rash prior to that but her boyfriend did have this type of rash and then she developed rash. Pt also states her daughter, who is only other family member living with her developed a small area on forehead, but was seen and treated.

## 2015-12-22 NOTE — ED Notes (Signed)
Pt now reports that she has been having vaginal bleeding over weekend, but bleeding has not stopped,

## 2015-12-22 NOTE — ED Provider Notes (Addendum)
CSN: [id number]     Arrival date & time 12/22/15  [id number] History   First MD Initiated Contact with Patient 12/22/15 [phone]     Chief Complaint  Patient presents with  . Rash     (Consider location/radiation/quality/duration/timing/severity/associated sxs/prior Treatment) HPI Comments: Patient presents with a rash. She was seen here at the end of November with a rash that was diagnosed as tinea versicolor. She states she used an antifungal cream for about a week with no improvement of symptoms. She states that since that time the rash is spread throughout her extremities and trunk. She states it's very itchy. She has a boyfriend with 2 small red patches on his forearm but no other known contacts with similar rash. She denies any fevers vomiting or other systemic signs of illness.  Patient is a 25 y.o. female presenting with rash.  Rash Associated symptoms: no abdominal pain, no diarrhea, no fatigue, no fever, no headaches, no joint pain, no nausea, no shortness of breath and not vomiting     History reviewed. No pertinent past medical history. Past Surgical History  Procedure Laterality Date  . Cesarean section     History reviewed. No pertinent family history. Social History  Substance Use Topics  . Smoking status: Current Every Day Smoker    Types: Cigarettes  . Smokeless tobacco: None  . Alcohol Use: No   OB History    Gravida Para Term Preterm AB TAB SAB Ectopic Multiple Living   1              Review of Systems  Constitutional: Negative for fever, chills, diaphoresis and fatigue.  HENT: Negative for congestion, rhinorrhea and sneezing.   Eyes: Negative.   Respiratory: Negative for cough, chest tightness and shortness of breath.   Cardiovascular: Negative for chest pain and leg swelling.  Gastrointestinal: Negative for nausea, vomiting, abdominal pain, diarrhea and blood in stool.  Genitourinary: Negative for frequency, hematuria, flank pain and difficulty urinating.   Musculoskeletal: Negative for back pain and arthralgias.  Skin: Positive for rash.  Neurological: Negative for dizziness, speech difficulty, weakness, numbness and headaches.      Allergies  Review of patient's allergies indicates no known allergies.  Home Medications   Prior to Admission medications   Medication Sig Start Date End Date Taking? Authorizing Provider  ketoconazole (NIZORAL) 2 % cream Apply 1 application topically daily. 11/17/15   Roxy Horsemanobert Browning, PA-C  triamcinolone (KENALOG) 0.025 % ointment Apply 1 application topically 2 (two) times daily. 12/22/15   Rolan BuccoMelanie Viridiana Spaid, MD   BP 145/69 mmHg  Pulse 92  Temp(Src) 98.1 F (36.7 C) (Oral)  Resp 16  Ht 5\' 5"  (1.651 m)  Wt 155 lb (70.308 kg)  BMI 25.79 kg/m2  SpO2 100%  LMP 11/07/2015 Physical Exam  Constitutional: She is oriented to person, place, and time. She appears well-developed and well-nourished.  HENT:  Head: Normocephalic and atraumatic.  Eyes: Pupils are equal, round, and reactive to light.  Neck: Normal range of motion. Neck supple.  Cardiovascular: Normal rate, regular rhythm and normal heart sounds.   Pulmonary/Chest: Effort normal and breath sounds normal. No respiratory distress. She has no wheezes. She has no rales. She exhibits no tenderness.  Abdominal: Soft. Bowel sounds are normal. There is no tenderness. There is no rebound and no guarding.  Musculoskeletal: Normal range of motion. She exhibits no edema.  Lymphadenopathy:    She has no cervical adenopathy.  Neurological: She is alert and oriented to person, place, and  time.  Skin: Skin is warm and dry. Rash noted.  Patient has multiple flat dark erythematous scaly lesions. They're circular and excoriated in areas. There is no papules. No vesicles. It's mostly on the flexor surfaces but does extend to the forearm and some on the trunk as well. There is none in the web spaces. There is none on the palms or soles.  Psychiatric: She has a normal mood  and affect.    ED Course  Procedures (including critical care time) Labs Review Labs Reviewed  PREGNANCY, URINE    Imaging Review No results found. I have personally reviewed and evaluated these images and lab results as part of my medical decision-making.   EKG Interpretation None      MDM   Final diagnoses:  Rash   PT with pruritic scaly rash.  There is no papules or other suggestions of scabies. It does not look bacterial. It could represent a fungal infection. I will have her continue using the ketoconazole cream and add triamcinolone cream as well. I gave her referral to follow-up with dermatology.  Pt with +pregnancy test.  Discussed with pharmacy staff, will change to OTC lotrimin and desonide cream.  On discharge, patient reports that she is having some vaginal bleeding. She states it was a little bit heavier 2 days ago and now is only spotting. She also reports some discomfort to her lower abdomen and thick vaginal discharge. A pelvic exam was done which showed yellow-brown discharge, moderate amount. There some mild generalized tenderness on pelvic exam but no specific cervical motion tenderness or adnexal tenderness. A pelvic ultrasound was done which showed a 6 weeks intrauterine pregnancy. Good fetal heart tones. Patient's Rh type is still pending but she doesn't have any ongoing bleeding or evident indication for program. She was encouraged to follow-up with an OB/GYN as soon as possible. She was advised to start a prenatal vitamin. She was given a prescription for Flagyl to treat her bacterial vaginosis.  Rolan Bucco, MD 12/22/15 1610  Rolan Bucco, MD 12/22/15 1010  Rolan Bucco, MD 12/22/15 1204

## 2015-12-22 NOTE — ED Notes (Signed)
Pt with increasing rash since nov, pt was seen and treated with tinea vesi, pt completed ointment that was prescribed, she states that the rash has spread to bilateral arms, torso and inside of thigh and perineum,

## 2015-12-22 NOTE — ED Notes (Signed)
Patient transported to Ultrasound 

## 2015-12-22 NOTE — Discharge Instructions (Signed)
First Trimester of Pregnancy The first trimester of pregnancy is from week 1 until the end of week 12 (months 1 through 3). A week after a sperm fertilizes an egg, the egg will implant on the wall of the uterus. This embryo will begin to develop into a baby. Genes from you and your partner are forming the baby. The female genes determine whether the baby is a boy or a girl. At 6-8 weeks, the eyes and face are formed, and the heartbeat can be seen on ultrasound. At the end of 12 weeks, all the baby's organs are formed.  Now that you are pregnant, you will want to do everything you can to have a healthy baby. Two of the most important things are to get good prenatal care and to follow your health care provider's instructions. Prenatal care is all the medical care you receive before the baby's birth. This care will help prevent, find, and treat any problems during the pregnancy and childbirth. BODY CHANGES Your body goes through many changes during pregnancy. The changes vary from woman to woman.   You may gain or lose a couple of pounds at first.  You may feel sick to your stomach (nauseous) and throw up (vomit). If the vomiting is uncontrollable, call your health care provider.  You may tire easily.  You may develop headaches that can be relieved by medicines approved by your health care provider.  You may urinate more often. Painful urination may mean you have a bladder infection.  You may develop heartburn as a result of your pregnancy.  You may develop constipation because certain hormones are causing the muscles that push waste through your intestines to slow down.  You may develop hemorrhoids or swollen, bulging veins (varicose veins).  Your breasts may begin to grow larger and become tender. Your nipples may stick out more, and the tissue that surrounds them (areola) may become darker.  Your gums may bleed and may be sensitive to brushing and flossing.  Dark spots or blotches (chloasma,  mask of pregnancy) may develop on your face. This will likely fade after the baby is born.  Your menstrual periods will stop.  You may have a loss of appetite.  You may develop cravings for certain kinds of food.  You may have changes in your emotions from day to day, such as being excited to be pregnant or being concerned that something may go wrong with the pregnancy and baby.  You may have more vivid and strange dreams.  You may have changes in your hair. These can include thickening of your hair, rapid growth, and changes in texture. Some women also have hair loss during or after pregnancy, or hair that feels dry or thin. Your hair will most likely return to normal after your baby is born. WHAT TO EXPECT AT YOUR PRENATAL VISITS During a routine prenatal visit:  You will be weighed to make sure you and the baby are growing normally.  Your blood pressure will be taken.  Your abdomen will be measured to track your baby's growth.  The fetal heartbeat will be listened to starting around week 10 or 12 of your pregnancy.  Test results from any previous visits will be discussed. Your health care provider may ask you:  How you are feeling.  If you are feeling the baby move.  If you have had any abnormal symptoms, such as leaking fluid, bleeding, severe headaches, or abdominal cramping.  If you are using any tobacco products,   including cigarettes, chewing tobacco, and electronic cigarettes.  If you have any questions. Other tests that may be performed during your first trimester include:  Blood tests to find your blood type and to check for the presence of any previous infections. They will also be used to check for low iron levels (anemia) and Rh antibodies. Later in the pregnancy, blood tests for diabetes will be done along with other tests if problems develop.  Urine tests to check for infections, diabetes, or protein in the urine.  An ultrasound to confirm the proper growth  and development of the baby.  An amniocentesis to check for possible genetic problems.  Fetal screens for spina bifida and Down syndrome.  You may need other tests to make sure you and the baby are doing well.  HIV (human immunodeficiency virus) testing. Routine prenatal testing includes screening for HIV, unless you choose not to have this test. HOME CARE INSTRUCTIONS  Medicines  Follow your health care provider's instructions regarding medicine use. Specific medicines may be either safe or unsafe to take during pregnancy.  Take your prenatal vitamins as directed.  If you develop constipation, try taking a stool softener if your health care provider approves. Diet  Eat regular, well-balanced meals. Choose a variety of foods, such as meat or vegetable-based protein, fish, milk and low-fat dairy products, vegetables, fruits, and whole grain breads and cereals. Your health care provider will help you determine the amount of weight gain that is right for you.  Avoid raw meat and uncooked cheese. These carry germs that can cause birth defects in the baby.  Eating four or five small meals rather than three large meals a day may help relieve nausea and vomiting. If you start to feel nauseous, eating a few soda crackers can be helpful. Drinking liquids between meals instead of during meals also seems to help nausea and vomiting.  If you develop constipation, eat more high-fiber foods, such as fresh vegetables or fruit and whole grains. Drink enough fluids to keep your urine clear or pale yellow. Activity and Exercise  Exercise only as directed by your health care provider. Exercising will help you:  Control your weight.  Stay in shape.  Be prepared for labor and delivery.  Experiencing pain or cramping in the lower abdomen or low back is a good sign that you should stop exercising. Check with your health care provider before continuing normal exercises.  Try to avoid standing for long  periods of time. Move your legs often if you must stand in one place for a long time.  Avoid heavy lifting.  Wear low-heeled shoes, and practice good posture.  You may continue to have sex unless your health care provider directs you otherwise. Relief of Pain or Discomfort  Wear a good support bra for breast tenderness.   Take warm sitz baths to soothe any pain or discomfort caused by hemorrhoids. Use hemorrhoid cream if your health care provider approves.   Rest with your legs elevated if you have leg cramps or low back pain.  If you develop varicose veins in your legs, wear support hose. Elevate your feet for 15 minutes, 3-4 times a day. Limit salt in your diet. Prenatal Care  Schedule your prenatal visits by the twelfth week of pregnancy. They are usually scheduled monthly at first, then more often in the last 2 months before delivery.  Write down your questions. Take them to your prenatal visits.  Keep all your prenatal visits as directed by your   health care provider. Safety  Wear your seat belt at all times when driving.  Make a list of emergency phone numbers, including numbers for family, friends, the hospital, and police and fire departments. General Tips  Ask your health care provider for a referral to a local prenatal education class. Begin classes no later than at the beginning of month 6 of your pregnancy.  Ask for help if you have counseling or nutritional needs during pregnancy. Your health care provider can offer advice or refer you to specialists for help with various needs.  Do not use hot tubs, steam rooms, or saunas.  Do not douche or use tampons or scented sanitary pads.  Do not cross your legs for long periods of time.  Avoid cat litter boxes and soil used by cats. These carry germs that can cause birth defects in the baby and possibly loss of the fetus by miscarriage or stillbirth.  Avoid all smoking, herbs, alcohol, and medicines not prescribed by  your health care provider. Chemicals in these affect the formation and growth of the baby.  Do not use any tobacco products, including cigarettes, chewing tobacco, and electronic cigarettes. If you need help quitting, ask your health care provider. You may receive counseling support and other resources to help you quit.  Schedule a dentist appointment. At home, brush your teeth with a soft toothbrush and be gentle when you floss. SEEK MEDICAL CARE IF:   You have dizziness.  You have mild pelvic cramps, pelvic pressure, or nagging pain in the abdominal area.  You have persistent nausea, vomiting, or diarrhea.  You have a bad smelling vaginal discharge.  You have pain with urination.  You notice increased swelling in your face, hands, legs, or ankles. SEEK IMMEDIATE MEDICAL CARE IF:   You have a fever.  You are leaking fluid from your vagina.  You have spotting or bleeding from your vagina.  You have severe abdominal cramping or pain.  You have rapid weight gain or loss.  You vomit blood or material that looks like coffee grounds.  You are exposed to German measles and have never had them.  You are exposed to fifth disease or chickenpox.  You develop a severe headache.  You have shortness of breath.  You have any kind of trauma, such as from a fall or a car accident.   This information is not intended to replace advice given to you by your health care provider. Make sure you discuss any questions you have with your health care provider.   Document Released: 11/30/2001 Document Revised: 12/27/2014 Document Reviewed: 10/16/2013 Elsevier Interactive Patient Education 2016 [employer]  

## 2015-12-23 LAB — GC/CHLAMYDIA PROBE AMP (~~LOC~~) NOT AT ARMC
Chlamydia: NEGATIVE
Neisseria Gonorrhea: NEGATIVE

## 2015-12-29 ENCOUNTER — Emergency Department (HOSPITAL_BASED_OUTPATIENT_CLINIC_OR_DEPARTMENT_OTHER)
Admission: EM | Admit: 2015-12-29 | Discharge: 2015-12-29 | Disposition: A | Discharge status: Home / Self Care | Payer: Medicaid Other | Attending: Emergency Medicine | Admitting: Emergency Medicine

## 2015-12-29 ENCOUNTER — Encounter (HOSPITAL_BASED_OUTPATIENT_CLINIC_OR_DEPARTMENT_OTHER): Payer: Self-pay | Admitting: Emergency Medicine

## 2015-12-29 DIAGNOSIS — O99331 Smoking (tobacco) complicating pregnancy, first trimester: Secondary | ICD-10-CM | POA: Insufficient documentation

## 2015-12-29 DIAGNOSIS — O210 Mild hyperemesis gravidarum: Secondary | ICD-10-CM | POA: Insufficient documentation

## 2015-12-29 DIAGNOSIS — O9989 Other specified diseases and conditions complicating pregnancy, childbirth and the puerperium: Secondary | ICD-10-CM | POA: Diagnosis not present

## 2015-12-29 DIAGNOSIS — F17210 Nicotine dependence, cigarettes, uncomplicated: Secondary | ICD-10-CM | POA: Diagnosis not present

## 2015-12-29 DIAGNOSIS — Z3A01 Less than 8 weeks gestation of pregnancy: Secondary | ICD-10-CM | POA: Diagnosis not present

## 2015-12-29 DIAGNOSIS — R109 Unspecified abdominal pain: Secondary | ICD-10-CM | POA: Insufficient documentation

## 2015-12-29 DIAGNOSIS — Z3491 Encounter for supervision of normal pregnancy, unspecified, first trimester: Secondary | ICD-10-CM

## 2015-12-29 DIAGNOSIS — R112 Nausea with vomiting, unspecified: Secondary | ICD-10-CM

## 2015-12-29 MED ORDER — ONDANSETRON HCL 4 MG/2ML IJ SOLN
4.0000 mg | Freq: Once | INTRAMUSCULAR | Status: AC
  Administered 2015-12-29: 4 mg via INTRAVENOUS
  Filled 2015-12-29: qty 2

## 2015-12-29 MED ORDER — ONDANSETRON 8 MG PO TBDP: 8.0000 mg | ORAL_TABLET | Freq: Three times a day (TID) | ORAL | Status: DC | PRN

## 2015-12-29 MED ORDER — PROMETHAZINE HCL 25 MG PO TABS: 25.0000 mg | ORAL_TABLET | Freq: Four times a day (QID) | ORAL | Status: DC | PRN

## 2015-12-29 MED ORDER — SODIUM CHLORIDE 0.9 % IV BOLUS (SEPSIS)
1500.0000 mL | Freq: Once | INTRAVENOUS | Status: AC
  Administered 2015-12-29: 1500 mL via INTRAVENOUS

## 2015-12-29 NOTE — Discharge Instructions (Signed)
First Trimester of Pregnancy The first trimester of pregnancy is from week 1 until the end of week 12 (months 1 through 3). A week after a sperm fertilizes an egg, the egg will implant on the wall of the uterus. This embryo will begin to develop into a baby. Genes from you and your partner are forming the baby. The female genes determine whether the baby is a boy or a girl. At 6-8 weeks, the eyes and face are formed, and the heartbeat can be seen on ultrasound. At the end of 12 weeks, all the baby's organs are formed.  Now that you are pregnant, you will want to do everything you can to have a healthy baby. Two of the most important things are to get good prenatal care and to follow your health care provider's instructions. Prenatal care is all the medical care you receive before the baby's birth. This care will help prevent, find, and treat any problems during the pregnancy and childbirth. BODY CHANGES Your body goes through many changes during pregnancy. The changes vary from woman to woman.   You may gain or lose a couple of pounds at first.  You may feel sick to your stomach (nauseous) and throw up (vomit). If the vomiting is uncontrollable, call your health care provider.  You may tire easily.  You may develop headaches that can be relieved by medicines approved by your health care provider.  You may urinate more often. Painful urination may mean you have a bladder infection.  You may develop heartburn as a result of your pregnancy.  You may develop constipation because certain hormones are causing the muscles that push waste through your intestines to slow down.  You may develop hemorrhoids or swollen, bulging veins (varicose veins).  Your breasts may begin to grow larger and become tender. Your nipples may stick out more, and the tissue that surrounds them (areola) may become darker.  Your gums may bleed and may be sensitive to brushing and flossing.  Dark spots or blotches (chloasma,  mask of pregnancy) may develop on your face. This will likely fade after the baby is born.  Your menstrual periods will stop.  You may have a loss of appetite.  You may develop cravings for certain kinds of food.  You may have changes in your emotions from day to day, such as being excited to be pregnant or being concerned that something may go wrong with the pregnancy and baby.  You may have more vivid and strange dreams.  You may have changes in your hair. These can include thickening of your hair, rapid growth, and changes in texture. Some women also have hair loss during or after pregnancy, or hair that feels dry or thin. Your hair will most likely return to normal after your baby is born. WHAT TO EXPECT AT YOUR PRENATAL VISITS During a routine prenatal visit:  You will be weighed to make sure you and the baby are growing normally.  Your blood pressure will be taken.  Your abdomen will be measured to track your baby's growth.  The fetal heartbeat will be listened to starting around week 10 or 12 of your pregnancy.  Test results from any previous visits will be discussed. Your health care provider may ask you:  How you are feeling.  If you are feeling the baby move.  If you have had any abnormal symptoms, such as leaking fluid, bleeding, severe headaches, or abdominal cramping.  If you are using any tobacco products,   including cigarettes, chewing tobacco, and electronic cigarettes.  If you have any questions. Other tests that may be performed during your first trimester include:  Blood tests to find your blood type and to check for the presence of any previous infections. They will also be used to check for low iron levels (anemia) and Rh antibodies. Later in the pregnancy, blood tests for diabetes will be done along with other tests if problems develop.  Urine tests to check for infections, diabetes, or protein in the urine.  An ultrasound to confirm the proper growth  and development of the baby.  An amniocentesis to check for possible genetic problems.  Fetal screens for spina bifida and Down syndrome.  You may need other tests to make sure you and the baby are doing well.  HIV (human immunodeficiency virus) testing. Routine prenatal testing includes screening for HIV, unless you choose not to have this test. HOME CARE INSTRUCTIONS  Medicines  Follow your health care provider's instructions regarding medicine use. Specific medicines may be either safe or unsafe to take during pregnancy.  Take your prenatal vitamins as directed.  If you develop constipation, try taking a stool softener if your health care provider approves. Diet  Eat regular, well-balanced meals. Choose a variety of foods, such as meat or vegetable-based protein, fish, milk and low-fat dairy products, vegetables, fruits, and whole grain breads and cereals. Your health care provider will help you determine the amount of weight gain that is right for you.  Avoid raw meat and uncooked cheese. These carry germs that can cause birth defects in the baby.  Eating four or five small meals rather than three large meals a day may help relieve nausea and vomiting. If you start to feel nauseous, eating a few soda crackers can be helpful. Drinking liquids between meals instead of during meals also seems to help nausea and vomiting.  If you develop constipation, eat more high-fiber foods, such as fresh vegetables or fruit and whole grains. Drink enough fluids to keep your urine clear or pale yellow. Activity and Exercise  Exercise only as directed by your health care provider. Exercising will help you:  Control your weight.  Stay in shape.  Be prepared for labor and delivery.  Experiencing pain or cramping in the lower abdomen or low back is a good sign that you should stop exercising. Check with your health care provider before continuing normal exercises.  Try to avoid standing for long  periods of time. Move your legs often if you must stand in one place for a long time.  Avoid heavy lifting.  Wear low-heeled shoes, and practice good posture.  You may continue to have sex unless your health care provider directs you otherwise. Relief of Pain or Discomfort  Wear a good support bra for breast tenderness.   Take warm sitz baths to soothe any pain or discomfort caused by hemorrhoids. Use hemorrhoid cream if your health care provider approves.   Rest with your legs elevated if you have leg cramps or low back pain.  If you develop varicose veins in your legs, wear support hose. Elevate your feet for 15 minutes, 3-4 times a day. Limit salt in your diet. Prenatal Care  Schedule your prenatal visits by the twelfth week of pregnancy. They are usually scheduled monthly at first, then more often in the last 2 months before delivery.  Write down your questions. Take them to your prenatal visits.  Keep all your prenatal visits as directed by your   health care provider. Safety  Wear your seat belt at all times when driving.  Make a list of emergency phone numbers, including numbers for family, friends, the hospital, and police and fire departments. General Tips  Ask your health care provider for a referral to a local prenatal education class. Begin classes no later than at the beginning of month 6 of your pregnancy.  Ask for help if you have counseling or nutritional needs during pregnancy. Your health care provider can offer advice or refer you to specialists for help with various needs.  Do not use hot tubs, steam rooms, or saunas.  Do not douche or use tampons or scented sanitary pads.  Do not cross your legs for long periods of time.  Avoid cat litter boxes and soil used by cats. These carry germs that can cause birth defects in the baby and possibly loss of the fetus by miscarriage or stillbirth.  Avoid all smoking, herbs, alcohol, and medicines not prescribed by  your health care provider. Chemicals in these affect the formation and growth of the baby.  Do not use any tobacco products, including cigarettes, chewing tobacco, and electronic cigarettes. If you need help quitting, ask your health care provider. You may receive counseling support and other resources to help you quit.  Schedule a dentist appointment. At home, brush your teeth with a soft toothbrush and be gentle when you floss. SEEK MEDICAL CARE IF:   You have dizziness.  You have mild pelvic cramps, pelvic pressure, or nagging pain in the abdominal area.  You have persistent nausea, vomiting, or diarrhea.  You have a bad smelling vaginal discharge.  You have pain with urination.  You notice increased swelling in your face, hands, legs, or ankles. SEEK IMMEDIATE MEDICAL CARE IF:   You have a fever.  You are leaking fluid from your vagina.  You have spotting or bleeding from your vagina.  You have severe abdominal cramping or pain.  You have rapid weight gain or loss.  You vomit blood or material that looks like coffee grounds.  You are exposed to German measles and have never had them.  You are exposed to fifth disease or chickenpox.  You develop a severe headache.  You have shortness of breath.  You have any kind of trauma, such as from a fall or a car accident.   This information is not intended to replace advice given to you by your health care provider. Make sure you discuss any questions you have with your health care provider.   Document Released: 11/30/2001 Document Revised: 12/27/2014 Document Reviewed: 10/16/2013 Elsevier Interactive Patient Education 2016 [employer]  

## 2015-12-29 NOTE — ED Notes (Signed)
Patient states she is [redacted] weeks pregnant.  She states she began nauseated and started vomiting yesterday.  She denies diarrhea or fever.

## 2015-12-29 NOTE — ED Notes (Signed)
Patient stable and ambulatory.  Patient verbalizes understanding of discharge medications, instructions and follow-up. 

## 2015-12-29 NOTE — ED Provider Notes (Signed)
CSN: [id number]     Arrival date & time 12/29/15  0734 History   First MD Initiated Contact with Patient 12/29/15 [phone]     Chief Complaint  Patient presents with  . Abdominal Pain    HPI Patient presents to the emergency department complaining of nausea and vomiting since yesterday.  No diarrhea or fever.  No dysuria or urinary frequency.  She is [redacted] weeks pregnant as confirmed by a first trimester ultrasound obtained on 12/22/2015.  At that time she was [redacted] weeks pregnant.  She reports abdominal cramping without focal abdominal pain.  She denies hematemesis.  No chest pain shortness of breath.  Symptoms are moderate in severity.  Nothing worsens or improves her symptoms   History reviewed. No pertinent past medical history. Past Surgical History  Procedure Laterality Date  . Cesarean section     History reviewed. No pertinent family history. Social History  Substance Use Topics  . Smoking status: Current Every Day Smoker    Types: Cigarettes  . Smokeless tobacco: None  . Alcohol Use: No   OB History    Gravida Para Term Preterm AB TAB SAB Ectopic Multiple Living   2              Review of Systems  All other systems reviewed and are negative.     Allergies  Review of patient's allergies indicates no known allergies.  Home Medications   Prior to Admission medications   Medication Sig Start Date End Date Taking? Authorizing Provider  metroNIDAZOLE (FLAGYL) 500 MG tablet Take 1 tablet (500 mg total) by mouth 2 (two) times daily. One po bid x 7 days 12/22/15  Yes Rolan BuccoMelanie Belfi, MD   BP 120/97 mmHg  Pulse 85  Temp(Src) 98.9 F (37.2 C) (Oral)  Resp 20  Ht 5\' 4"  (1.626 m)  Wt 160 lb (72.576 kg)  BMI 27.45 kg/m2  SpO2 100%  LMP 11/07/2015  Breastfeeding? No Physical Exam  Constitutional: She is oriented to person, place, and time. She appears well-developed and well-nourished. No distress.  HENT:  Head: Normocephalic and atraumatic.  Eyes: EOM are normal.  Neck: Normal  range of motion.  Cardiovascular: Normal rate and regular rhythm.   Pulmonary/Chest: Effort normal.  Abdominal: Soft. She exhibits no distension. There is no tenderness.  Musculoskeletal: Normal range of motion.  Neurological: She is alert and oriented to person, place, and time.  Skin: Skin is warm and dry.  Psychiatric: She has a normal mood and affect. Judgment normal.  Nursing note and vitals reviewed.   ED Course  Procedures (including critical care time) Labs Review Labs Reviewed - No data to display  Imaging Review No results found. I have personally reviewed and evaluated these images and lab results as part of my medical decision-making.   EKG Interpretation None      MDM   Final diagnoses:  None    9:37 AM Patient feels much better this time.  She was hydrated in the emergency department.  Discharge home with Zofran and Phenergan when necessary nausea.  Overall well-appearing.  OB follow-up.  She understands to return to the ER for new or worsening symptoms    Azalia BilisKevin Tayli Buch, MD 12/29/15 [phone]

## 2015-12-29 NOTE — ED Notes (Signed)
Patient states nausea has resolved

## 2015-12-31 ENCOUNTER — Emergency Department (HOSPITAL_BASED_OUTPATIENT_CLINIC_OR_DEPARTMENT_OTHER)
Admission: EM | Admit: 2015-12-31 | Discharge: 2015-12-31 | Disposition: A | Discharge status: Home / Self Care | Payer: Medicaid Other | Attending: Emergency Medicine | Admitting: Emergency Medicine

## 2015-12-31 ENCOUNTER — Encounter (HOSPITAL_BASED_OUTPATIENT_CLINIC_OR_DEPARTMENT_OTHER): Payer: Self-pay | Admitting: *Deleted

## 2015-12-31 DIAGNOSIS — Z87891 Personal history of nicotine dependence: Secondary | ICD-10-CM | POA: Diagnosis not present

## 2015-12-31 DIAGNOSIS — Z3A01 Less than 8 weeks gestation of pregnancy: Secondary | ICD-10-CM | POA: Diagnosis not present

## 2015-12-31 DIAGNOSIS — R1110 Vomiting, unspecified: Secondary | ICD-10-CM

## 2015-12-31 DIAGNOSIS — O210 Mild hyperemesis gravidarum: Secondary | ICD-10-CM | POA: Diagnosis not present

## 2015-12-31 DIAGNOSIS — Z792 Long term (current) use of antibiotics: Secondary | ICD-10-CM | POA: Diagnosis not present

## 2015-12-31 DIAGNOSIS — Z3490 Encounter for supervision of normal pregnancy, unspecified, unspecified trimester: Secondary | ICD-10-CM

## 2015-12-31 LAB — CBC WITH DIFFERENTIAL/PLATELET
BASOS PCT: 0 %
Basophils Absolute: 0 10*3/uL (ref 0.0–0.1)
EOS ABS: 0 10*3/uL (ref 0.0–0.7)
EOS PCT: 0 %
HCT: 37 % (ref 36.0–46.0)
Hemoglobin: 12.2 g/dL (ref 12.0–15.0)
Lymphocytes Relative: 23 %
Lymphs Abs: 2.9 10*3/uL (ref 0.7–4.0)
MCH: 26.1 pg (ref 26.0–34.0)
MCHC: 33 g/dL (ref 30.0–36.0)
MCV: 79.1 fL (ref 78.0–100.0)
Monocytes Absolute: 0.7 10*3/uL (ref 0.1–1.0)
Monocytes Relative: 6 %
Neutro Abs: 8.8 10*3/uL — ABNORMAL HIGH (ref 1.7–7.7)
Neutrophils Relative %: 71 %
PLATELETS: 276 10*3/uL (ref 150–400)
RBC: 4.68 MIL/uL (ref 3.87–5.11)
RDW: 13 % (ref 11.5–15.5)
WBC: 12.5 10*3/uL — ABNORMAL HIGH (ref 4.0–10.5)

## 2015-12-31 LAB — BASIC METABOLIC PANEL
Anion gap: 10 (ref 5–15)
BUN: 7 mg/dL (ref 6–20)
CALCIUM: 9.3 mg/dL (ref 8.9–10.3)
CO2: 24 mmol/L (ref 22–32)
Chloride: 104 mmol/L (ref 101–111)
Creatinine, Ser: 0.51 mg/dL (ref 0.44–1.00)
GFR calc Af Amer: >60 mL/min (ref >60)
Glucose, Bld: 92 mg/dL (ref 65–99)
Potassium: 3.4 mmol/L — ABNORMAL LOW (ref 3.5–5.1)
SODIUM: 138 mmol/L (ref 135–145)

## 2015-12-31 MED ORDER — SODIUM CHLORIDE 0.9 % IV BOLUS (SEPSIS)
1000.0000 mL | Freq: Once | INTRAVENOUS | Status: AC
  Administered 2015-12-31: 1000 mL via INTRAVENOUS

## 2015-12-31 MED ORDER — ONDANSETRON HCL 4 MG/2ML IJ SOLN
4.0000 mg | Freq: Once | INTRAMUSCULAR | Status: AC
  Administered 2015-12-31: 4 mg via INTRAVENOUS
  Filled 2015-12-31: qty 2

## 2015-12-31 MED ORDER — SODIUM CHLORIDE 0.9 % IV SOLN: INTRAVENOUS | Status: DC

## 2015-12-31 MED ORDER — PROMETHAZINE HCL 25 MG PO TABS: 25.0000 mg | ORAL_TABLET | Freq: Four times a day (QID) | ORAL | Status: DC | PRN

## 2015-12-31 MED FILL — PROMETHAZINE 25 MG TABLET: 25 | 3 days supply | Qty: 12 | Fill #0

## 2015-12-31 NOTE — ED Notes (Signed)
Patient states she has a five day history of nausea and vomiting.  Was seen here for the same two days ago and did not get her phenergan filled due to finances.

## 2015-12-31 NOTE — Discharge Instructions (Signed)
Take the Phenergan as directed on a regular basis to help with the nausea and vomiting of pregnancy. Your labs here today without significant abnormalities. Recommend small amounts of fluids frequently and small amounts of food frequently.

## 2015-12-31 NOTE — ED Provider Notes (Signed)
CSN: [id number]     Arrival date & time 12/31/15  0715 History   First MD Initiated Contact with Patient 12/31/15 [id number]     Chief Complaint  Patient presents with  . Emesis     (Consider location/radiation/quality/duration/timing/severity/associated sxs/prior Treatment) Patient is a 25 y.o. female presenting with vomiting. The history is provided by the patient.  Emesis Associated symptoms: no abdominal pain    patient is approximately 7-[redacted] weeks pregnant based on ultrasound on January 2. Patient with persistent nausea and vomiting. Patient recently seen on January 9 for same complaint. Patient with persistent nausea and vomiting not able to get her Zofran and Phenergan filled. No vaginal bleeding no significant abdominal pain. No real diarrhea. No fevers no dysuria. Patient's gravida 4 para 3 has had trouble with nausea and vomiting with prior pregnancies.  History reviewed. No pertinent past medical history. Past Surgical History  Procedure Laterality Date  . Cesarean section     No family history on file. Social History  Substance Use Topics  . Smoking status: Former Smoker    Types: Cigarettes  . Smokeless tobacco: None  . Alcohol Use: No   OB History    Gravida Para Term Preterm AB TAB SAB Ectopic Multiple Living   2              Review of Systems  Constitutional: Negative for fever.  HENT: Negative for congestion.   Eyes: Negative for visual disturbance.  Respiratory: Negative for shortness of breath.   Cardiovascular: Negative for chest pain.  Gastrointestinal: Positive for nausea and vomiting. Negative for abdominal pain.  Genitourinary: Negative for dysuria and vaginal bleeding.  Musculoskeletal: Negative for back pain.  Skin: Negative for rash.  Neurological: Negative for syncope.  Psychiatric/Behavioral: Negative for confusion.      Allergies  Review of patient's allergies indicates no known allergies.  Home Medications   Prior to Admission medications    Medication Sig Start Date End Date Taking? Authorizing Provider  metroNIDAZOLE (FLAGYL) 500 MG tablet Take 1 tablet (500 mg total) by mouth 2 (two) times daily. One po bid x 7 days 12/22/15   Rolan Bucco, MD  ondansetron (ZOFRAN ODT) 8 MG disintegrating tablet Take 1 tablet (8 mg total) by mouth every 8 (eight) hours as needed for nausea or vomiting. 12/29/15   Azalia Bilis, MD  promethazine (PHENERGAN) 25 MG tablet Take 1 tablet (25 mg total) by mouth every 6 (six) hours as needed for nausea or vomiting. 12/29/15   Azalia Bilis, MD  promethazine (PHENERGAN) 25 MG tablet Take 1 tablet (25 mg total) by mouth every 6 (six) hours as needed for nausea or vomiting. 12/31/15   Vanetta Mulders, MD   BP 124/78 mmHg  Pulse 78  Temp(Src) 99 F (37.2 C) (Oral)  Resp 16  SpO2 100%  LMP 11/07/2015 Physical Exam  Constitutional: She is oriented to person, place, and time. She appears well-developed and well-nourished. No distress.  HENT:  Head: Normocephalic and atraumatic.  Mouth/Throat: Oropharynx is clear and moist.  Eyes: Conjunctivae and EOM are normal. Pupils are equal, round, and reactive to light.  Neck: Normal range of motion. Neck supple.  Cardiovascular: Normal rate and regular rhythm.   No murmur heard. Abdominal: Soft. Bowel sounds are normal. There is no tenderness.  Musculoskeletal: Normal range of motion.  Neurological: She is alert and oriented to person, place, and time. No cranial nerve deficit. She exhibits normal muscle tone. Coordination normal.  Skin: Skin is warm.  Nursing  note and vitals reviewed.   ED Course  Procedures (including critical care time) Labs Review Labs Reviewed  BASIC METABOLIC PANEL - Abnormal; Notable for the following:    Potassium 3.4 (*)    All other components within normal limits  CBC WITH DIFFERENTIAL/PLATELET - Abnormal; Notable for the following:    WBC 12.5 (*)    Neutro Abs 8.8 (*)    All other components within normal limits   Results for  orders placed or performed during the hospital encounter of 12/31/15  Basic metabolic panel  Result Value Ref Range   Sodium 138 135 - 145 mmol/L   Potassium 3.4 (L) 3.5 - 5.1 mmol/L   Chloride 104 101 - 111 mmol/L   CO2 24 22 - 32 mmol/L   Glucose, Bld 92 65 - 99 mg/dL   BUN 7 6 - 20 mg/dL   Creatinine, Ser 1.610.51 0.44 - 1.00 mg/dL   Calcium 9.3 8.9 - 09.610.3 mg/dL   GFR calc non Af Amer >60 >60 mL/min   GFR calc Af Amer >60 >60 mL/min   Anion gap 10 5 - 15  CBC with Differential/Platelet  Result Value Ref Range   WBC 12.5 (H) 4.0 - 10.5 K/uL   RBC 4.68 3.87 - 5.11 MIL/uL   Hemoglobin 12.2 12.0 - 15.0 g/dL   HCT 04.537.0 40.936.0 - 81.146.0 %   MCV 79.1 78.0 - 100.0 fL   MCH 26.1 26.0 - 34.0 pg   MCHC 33.0 30.0 - 36.0 g/dL   RDW 91.413.0 78.211.5 - 95.615.5 %   Platelets 276 150 - 400 K/uL   Neutrophils Relative % 71 %   Neutro Abs 8.8 (H) 1.7 - 7.7 K/uL   Lymphocytes Relative 23 %   Lymphs Abs 2.9 0.7 - 4.0 K/uL   Monocytes Relative 6 %   Monocytes Absolute 0.7 0.1 - 1.0 K/uL   Eosinophils Relative 0 %   Eosinophils Absolute 0.0 0.0 - 0.7 K/uL   Basophils Relative 0 %   Basophils Absolute 0.0 0.0 - 0.1 K/uL     Imaging Review No results found. I have personally reviewed and evaluated these images and lab results as part of my medical decision-making.   EKG Interpretation None      MDM   Final diagnoses:  Pregnancy  Intractable vomiting with nausea, vomiting of unspecified type    Patient is approximately 7-[redacted] weeks pregnant. Ultrasound done January 2 which showed single intrauterine pregnancy. Patient with persistent problems with nausea and vomiting.. This gravida 4 para 3. Patient's electrolytes without significant abnormalities. Patient hydrated here with 1 L of normal saline. Patient given a additional prescription for the Phenergan. She never got the other scripts filled for the Zofran and Phenergan. Patient nontoxic no acute distress. Abdomen soft and nontender.    Vanetta MuldersScott  Brielyn Bosak, MD 12/31/15 [phone]

## 2016-01-15 ENCOUNTER — Emergency Department (HOSPITAL_BASED_OUTPATIENT_CLINIC_OR_DEPARTMENT_OTHER)
Admission: EM | Admit: 2016-01-15 | Discharge: 2016-01-15 | Disposition: A | Discharge status: Home / Self Care | Payer: Medicaid Other | Attending: Emergency Medicine | Admitting: Emergency Medicine

## 2016-01-15 ENCOUNTER — Encounter (HOSPITAL_BASED_OUTPATIENT_CLINIC_OR_DEPARTMENT_OTHER): Payer: Self-pay

## 2016-01-15 DIAGNOSIS — R112 Nausea with vomiting, unspecified: Secondary | ICD-10-CM

## 2016-01-15 DIAGNOSIS — O210 Mild hyperemesis gravidarum: Secondary | ICD-10-CM | POA: Insufficient documentation

## 2016-01-15 DIAGNOSIS — Z3A10 10 weeks gestation of pregnancy: Secondary | ICD-10-CM | POA: Diagnosis not present

## 2016-01-15 DIAGNOSIS — Z87891 Personal history of nicotine dependence: Secondary | ICD-10-CM | POA: Diagnosis not present

## 2016-01-15 LAB — URINALYSIS, ROUTINE W REFLEX MICROSCOPIC
GLUCOSE, UA: NEGATIVE mg/dL
Ketones, ur: >80 mg/dL — AB
Nitrite: NEGATIVE
PH: 6.5 (ref 5.0–8.0)
PROTEIN: 30 mg/dL — AB
Specific Gravity, Urine: 1.031 — ABNORMAL HIGH (ref 1.005–1.030)

## 2016-01-15 LAB — COMPREHENSIVE METABOLIC PANEL
ALT: 12 U/L — ABNORMAL LOW (ref 14–54)
AST: 18 U/L (ref 15–41)
Albumin: 4.1 g/dL (ref 3.5–5.0)
Alkaline Phosphatase: 34 U/L — ABNORMAL LOW (ref 38–126)
Anion gap: 11 (ref 5–15)
BUN: 7 mg/dL (ref 6–20)
CALCIUM: 9.4 mg/dL (ref 8.9–10.3)
CO2: 21 mmol/L — ABNORMAL LOW (ref 22–32)
Chloride: 109 mmol/L (ref 101–111)
Creatinine, Ser: 0.55 mg/dL (ref 0.44–1.00)
Glucose, Bld: 102 mg/dL — ABNORMAL HIGH (ref 65–99)
Potassium: 3.5 mmol/L (ref 3.5–5.1)
Sodium: 141 mmol/L (ref 135–145)
Total Bilirubin: 0.9 mg/dL (ref 0.3–1.2)
Total Protein: 7.4 g/dL (ref 6.5–8.1)

## 2016-01-15 LAB — CBC WITH DIFFERENTIAL/PLATELET
BASOS ABS: 0 10*3/uL (ref 0.0–0.1)
Basophils Relative: 0 %
EOS PCT: 0 %
Eosinophils Absolute: 0 10*3/uL (ref 0.0–0.7)
HEMATOCRIT: 35.5 % — AB (ref 36.0–46.0)
Hemoglobin: 12.3 g/dL (ref 12.0–15.0)
LYMPHS ABS: 1.3 10*3/uL (ref 0.7–4.0)
Lymphocytes Relative: 11 %
MCH: 26.6 pg (ref 26.0–34.0)
MCHC: 34.6 g/dL (ref 30.0–36.0)
MCV: 76.7 fL — ABNORMAL LOW (ref 78.0–100.0)
MONO ABS: 0.5 10*3/uL (ref 0.1–1.0)
Monocytes Relative: 4 %
NEUTROS ABS: 9.8 10*3/uL — AB (ref 1.7–7.7)
Neutrophils Relative %: 85 %
PLATELETS: 275 10*3/uL (ref 150–400)
RBC: 4.63 MIL/uL (ref 3.87–5.11)
RDW: 13 % (ref 11.5–15.5)
WBC: 11.6 10*3/uL — ABNORMAL HIGH (ref 4.0–10.5)

## 2016-01-15 LAB — LIPASE, BLOOD: LIPASE: 23 U/L (ref 11–51)

## 2016-01-15 LAB — URINE MICROSCOPIC-ADD ON

## 2016-01-15 LAB — HCG, QUANTITATIVE, PREGNANCY: hCG, Beta Chain, Quant, S: 102687 m[IU]/mL — ABNORMAL HIGH (ref <5)

## 2016-01-15 MED ORDER — PROMETHAZINE HCL 25 MG RE SUPP: 25.0000 mg | Freq: Four times a day (QID) | RECTAL | Status: DC | PRN

## 2016-01-15 MED ORDER — PROMETHAZINE HCL 25 MG/ML IJ SOLN
25.0000 mg | Freq: Once | INTRAMUSCULAR | Status: AC
  Administered 2016-01-15: 25 mg via INTRAVENOUS
  Filled 2016-01-15: qty 1

## 2016-01-15 MED ORDER — DEXTROSE 5 % IV SOLN
1.0000 g | Freq: Once | INTRAVENOUS | Status: AC
  Administered 2016-01-15: 1 g via INTRAVENOUS

## 2016-01-15 MED ORDER — SODIUM CHLORIDE 0.9 % IV BOLUS (SEPSIS)
1000.0000 mL | Freq: Once | INTRAVENOUS | Status: AC
  Administered 2016-01-15: 1000 mL via INTRAVENOUS

## 2016-01-15 MED ORDER — DOXYLAMINE-PYRIDOXINE 10-10 MG PO TBEC: 1.0000 | DELAYED_RELEASE_TABLET | ORAL | Status: DC

## 2016-01-15 MED ORDER — CEFTRIAXONE SODIUM 1 G IJ SOLR
INTRAMUSCULAR | Status: AC
  Filled 2016-01-15: qty 10

## 2016-01-15 MED ORDER — ONDANSETRON HCL 4 MG/2ML IJ SOLN
8.0000 mg | Freq: Once | INTRAMUSCULAR | Status: AC
  Administered 2016-01-15: 8 mg via INTRAVENOUS
  Filled 2016-01-15: qty 4

## 2016-01-15 MED ORDER — ONDANSETRON 4 MG PO TBDP: 4.0000 mg | ORAL_TABLET | Freq: Three times a day (TID) | ORAL | Status: DC | PRN

## 2016-01-15 NOTE — ED Notes (Signed)
Pt verbalizes understanding of d/c instructions and denies any further needs at this time. 

## 2016-01-15 NOTE — ED Notes (Signed)
Pt c/o n/v r/t pregnancy, no vomiting appreciated since arrival and pt's heart rate is normal, BP normal, skin turgor normal and coloring is normal.  Mucosa is moist as well.  Pt states she is taking the medications prescribed but that they are not helping.  Pt denies any abdominal pain or bleeding or discharge.

## 2016-01-15 NOTE — ED Notes (Signed)
Pt states she is still nauseous, requesting something else.  No emesis episodes since arrival to ED and pt tolerating ice chips.

## 2016-01-15 NOTE — ED Notes (Signed)
Pt has hx of emesis with pregnancy, states she was at [facility] earlier today for same but that since she came home she has not been able to hold fluids down and states she is vomiting "dark blood and clots."

## 2016-01-15 NOTE — Discharge Instructions (Signed)
Hyperemesis Gravidarum Hyperemesis gravidarum is a severe form of nausea and vomiting that happens during pregnancy. Hyperemesis is worse than morning sickness. It may cause you to have nausea or vomiting all day for many days. It may keep you from eating and drinking enough food and liquids. Hyperemesis usually occurs during the first half (the first 20 weeks) of pregnancy. It often goes away once a woman is in her second half of pregnancy. However, sometimes hyperemesis continues through an entire pregnancy.  CAUSES  The cause of this condition is not completely known but is thought to be related to changes in the body's hormones when pregnant. It could be from the high level of the pregnancy hormone or an increase in estrogen in the body.  SIGNS AND SYMPTOMS   Severe nausea and vomiting.  Nausea that does not go away.  Vomiting that does not allow you to keep any food down.  Weight loss and body fluid loss (dehydration).  Having no desire to eat or not liking food you have previously enjoyed. DIAGNOSIS  Your health care provider will do a physical exam and ask you about your symptoms. He or she may also order blood tests and urine tests to make sure something else is not causing the problem.  TREATMENT  You may only need medicine to control the problem. If medicines do not control the nausea and vomiting, you will be treated in the hospital to prevent dehydration, increased acid in the blood (acidosis), weight loss, and changes in the electrolytes in your body that may harm the unborn baby (fetus). You may need IV fluids.  HOME CARE INSTRUCTIONS   Only take over-the-counter or prescription medicines as directed by your health care provider.  Try eating a couple of dry crackers or toast in the morning before getting out of bed.  Avoid foods and smells that upset your stomach.  Avoid fatty and spicy foods.  Eat 5-6 small meals a day.  Do not drink when eating meals. Drink between  meals.  For snacks, eat high-protein foods, such as cheese.  Eat or suck on things that have ginger in them. Ginger helps nausea.  Avoid food preparation. The smell of food can spoil your appetite.  Avoid iron pills and iron in your multivitamins until after 3-4 months of being pregnant. However, consult with your health care provider before stopping any prescribed iron pills. SEEK MEDICAL CARE IF:   Your abdominal pain increases.  You have a severe headache.  You have vision problems.  You are losing weight. SEEK IMMEDIATE MEDICAL CARE IF:   You are unable to keep fluids down.  You vomit blood.  You have constant nausea and vomiting.  You have excessive weakness.  You have extreme thirst.  You have dizziness or fainting.  You have a fever or persistent symptoms for more than 2-3 days.  You have a fever and your symptoms suddenly get worse. MAKE SURE YOU:   Understand these instructions.  Will watch your condition.  Will get help right away if you are not doing well or get worse.   This information is not intended to replace advice given to you by your health care provider. Make sure you discuss any questions you have with your health care provider.   Document Released: 12/06/2005 Document Revised: 09/26/2013 Document Reviewed: 07/18/2013 Elsevier Interactive Patient Education 2016 [employer].   Eating Plan for Hyperemesis Gravidarum Severe cases of hyperemesis gravidarum can lead to dehydration and malnutrition. The hyperemesis eating plan  is one way to lessen the symptoms of nausea and vomiting. It is often used with prescribed medicines to control your symptoms.  WHAT CAN I DO TO RELIEVE MY SYMPTOMS? Listen to your body. Everyone is different and has different preferences. Find what works best for you. Some of the following things may help:  Eat and drink slowly.  Eat 5-6 small meals daily instead of 3 large meals.   Eat crackers before you get out of  bed in the morning.   Starchy foods are usually well tolerated (such as cereal, toast, bread, potatoes, pasta, rice, and pretzels).   Ginger may help with nausea. Add  tsp ground ginger to hot tea or choose ginger tea.   Try drinking 100% fruit juice or an electrolyte drink.  Continue to take your prenatal vitamins as directed by your health care provider. If you are having trouble taking your prenatal vitamins, talk with your health care provider about different options.  Include at least 1 serving of protein with your meals and snacks (such as meats or poultry, beans, nuts, eggs, or yogurt). Try eating a protein-rich snack before bed (such as cheese and crackers or a half [place] or peanut butter sandwich). WHAT THINGS SHOULD I AVOID TO REDUCE MY SYMPTOMS? The following things may help reduce your symptoms:  Avoid foods with strong smells. Try eating meals in well-ventilated areas that are free of odors.  Avoid drinking water or other beverages with meals. Try not to drink anything less than 30 minutes before and after meals.  Avoid drinking more than 1 cup of fluid at a time.  Avoid fried or high-fat foods, such as butter and cream sauces.  Avoid spicy foods.  Avoid skipping meals the best you can. Nausea can be more intense on an empty stomach. If you cannot tolerate food at that time, do not force it. Try sucking on ice chips or other frozen items and make up the calories later.  Avoid lying down within 2 hours after eating.   This information is not intended to replace advice given to you by your health care provider. Make sure you discuss any questions you have with your health care provider.   Document Released: 10/03/2007 Document Revised: 12/11/2013 Document Reviewed: 10/10/2013 Elsevier Interactive Patient Education [employer]   First Trimester of Pregnancy The first trimester of pregnancy is from week 1 until the end of week 12 (months 1 through 3). A week  after a sperm fertilizes an egg, the egg will implant on the wall of the uterus. This embryo will begin to develop into a baby. Genes from you and your partner are forming the baby. The female genes determine whether the baby is a boy or a girl. At 6-8 weeks, the eyes and face are formed, and the heartbeat can be seen on ultrasound. At the end of 12 weeks, all the baby's organs are formed.  Now that you are pregnant, you will want to do everything you can to have a healthy baby. Two of the most important things are to get good prenatal care and to follow your health care provider's instructions. Prenatal care is all the medical care you receive before the baby's birth. This care will help prevent, find, and treat any problems during the pregnancy and childbirth. BODY CHANGES Your body goes through many changes during pregnancy. The changes vary from woman to woman.   You may gain or lose a couple of pounds at first.  You may feel  sick to your stomach (nauseous) and throw up (vomit). If the vomiting is uncontrollable, call your health care provider.  You may tire easily.  You may develop headaches that can be relieved by medicines approved by your health care provider.  You may urinate more often. Painful urination may mean you have a bladder infection.  You may develop heartburn as a result of your pregnancy.  You may develop constipation because certain hormones are causing the muscles that push waste through your intestines to slow down.  You may develop hemorrhoids or swollen, bulging veins (varicose veins).  Your breasts may begin to grow larger and become tender. Your nipples may stick out more, and the tissue that surrounds them (areola) may become darker.  Your gums may bleed and may be sensitive to brushing and flossing.  Dark spots or blotches (chloasma, mask of pregnancy) may develop on your face. This will likely fade after the baby is born.  Your menstrual periods will  stop.  You may have a loss of appetite.  You may develop cravings for certain kinds of food.  You may have changes in your emotions from day to day, such as being excited to be pregnant or being concerned that something may go wrong with the pregnancy and baby.  You may have more vivid and strange dreams.  You may have changes in your hair. These can include thickening of your hair, rapid growth, and changes in texture. Some women also have hair loss during or after pregnancy, or hair that feels dry or thin. Your hair will most likely return to normal after your baby is born. WHAT TO EXPECT AT YOUR PRENATAL VISITS During a routine prenatal visit:  You will be weighed to make sure you and the baby are growing normally.  Your blood pressure will be taken.  Your abdomen will be measured to track your baby's growth.  The fetal heartbeat will be listened to starting around week 10 or 12 of your pregnancy.  Test results from any previous visits will be discussed. Your health care provider may ask you:  How you are feeling.  If you are feeling the baby move.  If you have had any abnormal symptoms, such as leaking fluid, bleeding, severe headaches, or abdominal cramping.  If you are using any tobacco products, including cigarettes, chewing tobacco, and electronic cigarettes.  If you have any questions. Other tests that may be performed during your first trimester include:  Blood tests to find your blood type and to check for the presence of any previous infections. They will also be used to check for low iron levels (anemia) and Rh antibodies. Later in the pregnancy, blood tests for diabetes will be done along with other tests if problems develop.  Urine tests to check for infections, diabetes, or protein in the urine.  An ultrasound to confirm the proper growth and development of the baby.  An amniocentesis to check for possible genetic problems.  Fetal screens for spina bifida  and Down syndrome.  You may need other tests to make sure you and the baby are doing well.  HIV (human immunodeficiency virus) testing. Routine prenatal testing includes screening for HIV, unless you choose not to have this test. HOME CARE INSTRUCTIONS  Medicines  Follow your health care provider's instructions regarding medicine use. Specific medicines may be either safe or unsafe to take during pregnancy.  Take your prenatal vitamins as directed.  If you develop constipation, try taking a stool softener if your  health care provider approves. Diet  Eat regular, well-balanced meals. Choose a variety of foods, such as meat or vegetable-based protein, fish, milk and low-fat dairy products, vegetables, fruits, and whole grain breads and cereals. Your health care provider will help you determine the amount of weight gain that is right for you.  Avoid raw meat and uncooked cheese. These carry germs that can cause birth defects in the baby.  Eating four or five small meals rather than three large meals a day may help relieve nausea and vomiting. If you start to feel nauseous, eating a few soda crackers can be helpful. Drinking liquids between meals instead of during meals also seems to help nausea and vomiting.  If you develop constipation, eat more high-fiber foods, such as fresh vegetables or fruit and whole grains. Drink enough fluids to keep your urine clear or pale yellow. Activity and Exercise  Exercise only as directed by your health care provider. Exercising will help you:  Control your weight.  Stay in shape.  Be prepared for labor and delivery.  Experiencing pain or cramping in the lower abdomen or low back is a good sign that you should stop exercising. Check with your health care provider before continuing normal exercises.  Try to avoid standing for long periods of time. Move your legs often if you must stand in one place for a long time.  Avoid heavy lifting.  Wear  low-heeled shoes, and practice good posture.  You may continue to have sex unless your health care provider directs you otherwise. Relief of Pain or Discomfort  Wear a good support bra for breast tenderness.   Take warm sitz baths to soothe any pain or discomfort caused by hemorrhoids. Use hemorrhoid cream if your health care provider approves.   Rest with your legs elevated if you have leg cramps or low back pain.  If you develop varicose veins in your legs, wear support hose. Elevate your feet for 15 minutes, 3-4 times a day. Limit salt in your diet. Prenatal Care  Schedule your prenatal visits by the twelfth week of pregnancy. They are usually scheduled monthly at first, then more often in the last 2 months before delivery.  Write down your questions. Take them to your prenatal visits.  Keep all your prenatal visits as directed by your health care provider. Safety  Wear your seat belt at all times when driving.  Make a list of emergency phone numbers, including numbers for family, friends, the hospital, and police and fire departments. General Tips  Ask your health care provider for a referral to a local prenatal education class. Begin classes no later than at the beginning of month 6 of your pregnancy.  Ask for help if you have counseling or nutritional needs during pregnancy. Your health care provider can offer advice or refer you to specialists for help with various needs.  Do not use hot tubs, steam rooms, or saunas.  Do not douche or use tampons or scented sanitary pads.  Do not cross your legs for long periods of time.  Avoid cat litter boxes and soil used by cats. These carry germs that can cause birth defects in the baby and possibly loss of the fetus by miscarriage or stillbirth.  Avoid all smoking, herbs, alcohol, and medicines not prescribed by your health care provider. Chemicals in these affect the formation and growth of the baby.  Do not use any tobacco  products, including cigarettes, chewing tobacco, and electronic cigarettes. If you need help  quitting, ask your health care provider. You may receive counseling support and other resources to help you quit.  Schedule a dentist appointment. At home, brush your teeth with a soft toothbrush and be gentle when you floss. SEEK MEDICAL CARE IF:   You have dizziness.  You have mild pelvic cramps, pelvic pressure, or nagging pain in the abdominal area.  You have persistent nausea, vomiting, or diarrhea.  You have a bad smelling vaginal discharge.  You have pain with urination.  You notice increased swelling in your face, hands, legs, or ankles. SEEK IMMEDIATE MEDICAL CARE IF:   You have a fever.  You are leaking fluid from your vagina.  You have spotting or bleeding from your vagina.  You have severe abdominal cramping or pain.  You have rapid weight gain or loss.  You vomit blood or material that looks like coffee grounds.  You are exposed to [place] measles and have never had them.  You are exposed to fifth disease or chickenpox.  You develop a severe headache.  You have shortness of breath.  You have any kind of trauma, such as from a fall or a car accident.   This information is not intended to replace advice given to you by your health care provider. Make sure you discuss any questions you have with your health care provider.   Document Released: 11/30/2001 Document Revised: 12/27/2014 Document Reviewed: 10/16/2013 Elsevier Interactive Patient Education [employer]

## 2016-01-15 NOTE — ED Notes (Signed)
Pt states she feels much better and nausea is resolved.

## 2016-01-15 NOTE — ED Provider Notes (Signed)
TIME SEEN: 2:10 AM  CHIEF COMPLAINT: Nausea, vomiting  HPI: Pt is a 25 y.o. female who is a G4 P2 A1 who is 9 weeks and 4 days by LMP and ultrasound on 12/22/2015 who presents to the emergency department with nausea and vomiting. Reports she has had vomiting for 2 days. States she was in [facility] yesterday morning and treated with fluids, potassium and discharged with Phenergan. Reports that she tried taking Phenergan at home without relief when vomiting returned last night. States she noticed streaks of blood in her vomit and then noticed more blood later. She denies to me that she is vomiting clots. She states that she is vomiting "chunks of food". Also has had some mild soft stool. No bloody stools or melena. No abdominal pain. No new vaginal discharge or bleeding. No dysuria or hematuria.  No sick contacts or recent travel.  Pt has had h/o hyperemesis gravidarum before. Urine drug screen at [facility] yesterday was positive for marijuana. States she used to smoke marijuana every day but stopped recently. She thinks this is also contributing to her vomiting. Her OB/GYN is Dr. Shawnie Pons in [facility]. She has not yet seen them for this procedure but again did have an ultrasound in the emergency department on 12/22/15 which confirmed a single IUP with heart rate of 111. No subchorionic hemorrhage. States she plans to terminate this pregnancy.  ROS: See HPI Constitutional: no fever  Eyes: no drainage  ENT: no runny nose   Cardiovascular:  no chest pain  Resp: no SOB  GI:  vomiting GU: no dysuria Integumentary: no rash  Allergy: no hives  Musculoskeletal: no leg swelling  Neurological: no slurred speech ROS otherwise negative  PAST MEDICAL HISTORY/PAST SURGICAL HISTORY:  History reviewed. No pertinent past medical history.  MEDICATIONS:  Prior to Admission medications   Medication Sig Start Date End Date Taking? Authorizing Provider  metroNIDAZOLE (FLAGYL) 500 MG tablet Take 1 tablet  (500 mg total) by mouth 2 (two) times daily. One po bid x 7 days 12/22/15   Rolan Bucco, MD  ondansetron (ZOFRAN ODT) 8 MG disintegrating tablet Take 1 tablet (8 mg total) by mouth every 8 (eight) hours as needed for nausea or vomiting. 12/29/15   Azalia Bilis, MD  promethazine (PHENERGAN) 25 MG tablet Take 1 tablet (25 mg total) by mouth every 6 (six) hours as needed for nausea or vomiting. 12/29/15   Azalia Bilis, MD  promethazine (PHENERGAN) 25 MG tablet Take 1 tablet (25 mg total) by mouth every 6 (six) hours as needed for nausea or vomiting. 12/31/15   Vanetta Mulders, MD    ALLERGIES:  No Known Allergies  SOCIAL HISTORY:  Social History  Substance Use Topics  . Smoking status: Former Smoker    Types: Cigarettes  . Smokeless tobacco: Not on file  . Alcohol Use: No    FAMILY HISTORY: No family history on file.  EXAM: BP 150/74 mmHg  Pulse 67  Temp(Src) 98.3 F (36.8 C) (Oral)  Resp 18  Ht  (1.651 m)  Wt 155 lb (70.308 kg)  BMI 25.79 kg/m2  SpO2 100%  LMP 11/07/2015 CONSTITUTIONAL: Alert and oriented and responds appropriately to questions. Well-appearing; well-nourished, afebrile, nontoxic HEAD: Normocephalic EYES: Conjunctivae clear, PERRL ENT: normal nose; no rhinorrhea; moist mucous membranes; pharynx without lesions noted NECK: Supple, no meningismus, no LAD  CARD: RRR; S1 and S2 appreciated; no murmurs, no clicks, no rubs, no gallops RESP: Normal chest excursion without splinting or tachypnea;  breath sounds clear and equal bilaterally; no wheezes, no rhonchi, no rales, no hypoxia or respiratory distress, speaking full sentences ABD/GI: Normal bowel sounds; non-distended; soft, non-tender, no rebound, no guarding, no peritoneal signs BACK:  The back appears normal and is non-tender to palpation, there is no CVA tenderness EXT: Normal ROM in all joints; non-tender to palpation; no edema; normal capillary refill; no cyanosis, no calf tenderness or swelling    SKIN:  Normal color for age and race; warm NEURO: Moves all extremities equally, sensation to light touch intact diffusely, cranial nerves II through XII intact PSYCH: The patient's mood and manner are appropriate. Grooming and personal hygiene are appropriate.  MEDICAL DECISION MAKING: Patient complains of persistent vomiting. Reports she has seen blood in her vomit. Last episode of vomiting it was at 1 AM. She does not appear significantly dehydrated on exam but does have large ketones in her urine. Will give 2 L of IV fluids. Abdominal exam is benign. We'll check labs.  Urine does appear to be infected. We'll send culture and treat with ceftriaxone.  Blood type is O+. Denies any vaginal bleeding. Discussed with patient that there are some risks with Phenergan (category C) and Zofran (category B but may have some correlation with cardiac defects). She understands these risks and states that she is okay with them because she plans to terminate this pregnancy.  I do feel if she is vomiting and cannot stop them there is more risk to her for dehydration and these medications are clinically indicated.  ED PROGRESS: Labs show leukocytosis with left shift. This appears chronic for patient. Slightly low bicarbonate 21 with normal anion gap. LFTs, lipase, creatinine normal. HCG is 102,687 and rising appropriately. We'll continue to hydrate patient, give antiemetics as needed. We'll fluid challenge patient, check fetal heart tones.   4:00 AM  Pt has not had any further vomiting on the emergency room. She reports feeling better but is requesting something else for nausea to "take the edge off". Will give dose of IV Phenergan. Performed bedside ultrasound showed single IUP with normal fetal activity and fetal heart rate of 169.  4:45 AM  Pt reports feeling much better. She has not vomited in the 3 hours that she has been in the emergency department. She is able to drink without difficulty. She has received 2 L of IV  fluids. Advised her to slowly advance her diet. Patient does have Medicaid. We'll discharge with prescription for diclegis, Zofran, Phenergan suppositories. Have advised her to follow-up with her OB/GYN Dr. Shawnie Pons. I discussed return precautions. She verbalized understanding and is comfortable with this plan.  EMERGENCY DEPARTMENT [place] PREGNANCY "Study: Limited Ultrasound of the Pelvis for Pregnancy"  INDICATIONS:Pregnancy(required) Multiple views of the uterus and pelvic cavity were obtained in real-time with a multi-frequency probe.  APPROACH:Transabdominal   PERFORMED BY: Myself  IMAGES ARCHIVED?: No  LIMITATIONS: Decompressed bladder  PREGNANCY FREE FLUID: None  ADNEXAL FINDINGS:Left ovary not seen; right ovary not seen  PREGNANCY FINDINGS: Fetal heart activity seen  INTERPRETATION: Viable intrauterine pregnancy and Non-viable intrauterine pregnancy  GESTATIONAL AGE, ESTIMATE: 9 weeks 4 days  FETAL HEART RATE: 169  CPT Codes:  16109-60 (transabdominal OB)  768-26-52 (transvaginal OB, Reduced level of service for incomplete exam)      Layla Maw Ward, DO 01/15/16 [id number]

## 2016-01-15 NOTE — ED Notes (Signed)
Unable to obtain fetal heart tones on pt

## 2016-01-16 LAB — URINE CULTURE

## 2016-01-19 ENCOUNTER — Telehealth (HOSPITAL_COMMUNITY): Payer: Self-pay

## 2016-01-19 NOTE — Telephone Encounter (Signed)
Post ED Visit - Positive Culture Follow-up  Culture report reviewed by antimicrobial stewardship pharmacist:   Laura Duncan, Pharm.D.  Celedonio Miyamoto, Pharm.D., BCPS  Garvin Fila, Pharm.D.  Georgina Pillion, Pharm.D., BCPS  [place], [address].D., BCPS, AAHIVP  Estella Husk, Pharm.D., BCPS, AAHIVP  Tennis Must, Pharm.D.  Sherle Poe, [place].D.  Positive urine culture, >/= 100,000 colonies -> Diptheroids OK  Arvid Right 01/19/2016, 4:12 AM

## 2016-05-09 ENCOUNTER — Emergency Department (HOSPITAL_BASED_OUTPATIENT_CLINIC_OR_DEPARTMENT_OTHER)
Admission: EM | Admit: 2016-05-09 | Discharge: 2016-05-10 | Disposition: A | Discharge status: Home / Self Care | Payer: Medicaid Other | Attending: Emergency Medicine | Admitting: Emergency Medicine

## 2016-05-09 ENCOUNTER — Emergency Department (HOSPITAL_BASED_OUTPATIENT_CLINIC_OR_DEPARTMENT_OTHER): Payer: Medicaid Other

## 2016-05-09 ENCOUNTER — Encounter (HOSPITAL_BASED_OUTPATIENT_CLINIC_OR_DEPARTMENT_OTHER): Payer: Self-pay | Admitting: Emergency Medicine

## 2016-05-09 DIAGNOSIS — E860 Dehydration: Secondary | ICD-10-CM | POA: Insufficient documentation

## 2016-05-09 DIAGNOSIS — Z87891 Personal history of nicotine dependence: Secondary | ICD-10-CM | POA: Diagnosis not present

## 2016-05-09 DIAGNOSIS — R197 Diarrhea, unspecified: Secondary | ICD-10-CM | POA: Insufficient documentation

## 2016-05-09 DIAGNOSIS — R112 Nausea with vomiting, unspecified: Secondary | ICD-10-CM

## 2016-05-09 DIAGNOSIS — R110 Nausea: Secondary | ICD-10-CM | POA: Diagnosis present

## 2016-05-09 DIAGNOSIS — R109 Unspecified abdominal pain: Secondary | ICD-10-CM | POA: Insufficient documentation

## 2016-05-09 LAB — URINALYSIS, ROUTINE W REFLEX MICROSCOPIC
BILIRUBIN URINE: NEGATIVE
GLUCOSE, UA: NEGATIVE mg/dL
Ketones, ur: 40 mg/dL — AB
Leukocytes, UA: NEGATIVE
Nitrite: NEGATIVE
PH: 7.5 (ref 5.0–8.0)
Protein, ur: NEGATIVE mg/dL
SPECIFIC GRAVITY, URINE: 1.025 (ref 1.005–1.030)

## 2016-05-09 LAB — COMPREHENSIVE METABOLIC PANEL WITH GFR
ALT: 17 U/L (ref 14–54)
AST: 33 U/L (ref 15–41)
Albumin: 4.6 g/dL (ref 3.5–5.0)
Alkaline Phosphatase: 47 U/L (ref 38–126)
Anion gap: 13 (ref 5–15)
BUN: 12 mg/dL (ref 6–20)
CO2: 17 mmol/L — ABNORMAL LOW (ref 22–32)
Calcium: 9.6 mg/dL (ref 8.9–10.3)
Chloride: 108 mmol/L (ref 101–111)
Creatinine, Ser: 0.75 mg/dL (ref 0.44–1.00)
GFR calc Af Amer: >60 mL/min
GFR calc non Af Amer: >60 mL/min
Glucose, Bld: 135 mg/dL — ABNORMAL HIGH (ref 65–99)
Potassium: 3.7 mmol/L (ref 3.5–5.1)
Sodium: 138 mmol/L (ref 135–145)
Total Bilirubin: 1.2 mg/dL (ref 0.3–1.2)
Total Protein: 8.2 g/dL — ABNORMAL HIGH (ref 6.5–8.1)

## 2016-05-09 LAB — CBC
HCT: 40.1 % (ref 36.0–46.0)
Hemoglobin: 14.1 g/dL (ref 12.0–15.0)
MCH: 27.5 pg (ref 26.0–34.0)
MCHC: 35.2 g/dL (ref 30.0–36.0)
MCV: 78.3 fL (ref 78.0–100.0)
Platelets: 255 10*3/uL (ref 150–400)
RBC: 5.12 MIL/uL — ABNORMAL HIGH (ref 3.87–5.11)
RDW: 13.4 % (ref 11.5–15.5)
WBC: 20 10*3/uL — ABNORMAL HIGH (ref 4.0–10.5)

## 2016-05-09 LAB — URINE MICROSCOPIC-ADD ON: WBC, UA: NONE SEEN WBC/hpf (ref 0–5)

## 2016-05-09 LAB — LIPASE, BLOOD: Lipase: 20 U/L (ref 11–51)

## 2016-05-09 LAB — PREGNANCY, URINE: Preg Test, Ur: NEGATIVE

## 2016-05-09 MED ORDER — ONDANSETRON 4 MG PO TBDP
4.0000 mg | ORAL_TABLET | Freq: Once | ORAL | Status: AC | PRN
  Administered 2016-05-09: 4 mg via ORAL
  Filled 2016-05-09: qty 1

## 2016-05-09 MED ORDER — SODIUM CHLORIDE 0.9 % IV SOLN
1000.0000 mL | INTRAVENOUS | Status: DC
  Administered 2016-05-10: 1000 mL via INTRAVENOUS

## 2016-05-09 MED ORDER — ONDANSETRON HCL 4 MG/2ML IJ SOLN
4.0000 mg | Freq: Once | INTRAMUSCULAR | Status: AC | PRN
  Administered 2016-05-09: 4 mg via INTRAVENOUS
  Filled 2016-05-09: qty 2

## 2016-05-09 MED ORDER — SODIUM CHLORIDE 0.9 % IV SOLN
1000.0000 mL | Freq: Once | INTRAVENOUS | Status: AC
  Administered 2016-05-09: 1000 mL via INTRAVENOUS

## 2016-05-09 MED ORDER — IOPAMIDOL (ISOVUE-300) INJECTION 61%
100.0000 mL | Freq: Once | INTRAVENOUS | Status: AC | PRN
  Administered 2016-05-10: 100 mL via INTRAVENOUS

## 2016-05-09 NOTE — ED Notes (Signed)
Patient reports that she feels like her bilateral arms and legs are numb. Patient up to wheelchair to go to the bathroom and sits right back down on bed. Stayed with patient for safety. Patient stood and into wheelchair and patient is able to stand and pivote but continues to report numbness to legs and arms. Patient reports that when she was standing it was better. ptient urinated and back to her room, in bed. Orders received and carried out.

## 2016-05-09 NOTE — ED Notes (Signed)
Woke up this morning with nausea and vomiting. Denies abd pain. States she had similar episode in January when she found out she was pregnant. Underwent elective abortion. Was asymptomatic till this morning.   Last menstrual cycle.  : 4 weeks ago.

## 2016-05-09 NOTE — ED Notes (Signed)
Patient states that she is having numbness and tingling all over, Patient is calm and cooperative at this time. Patient made more comfortable

## 2016-05-09 NOTE — ED Notes (Signed)
Actively vomiting in WR. Initiated N/V triage protocol. Medicated with zofran 4mg  ODT

## 2016-05-09 NOTE — ED Notes (Signed)
Pt states she is unable to urinate at this time. Drinking contrast. States she may be able to do that after drinking contrast.

## 2016-05-09 NOTE — ED Provider Notes (Signed)
CSN: [id number]     Arrival date & time 05/09/16  2013 History   First MD Initiated Contact with Patient 05/09/16 2242     Chief Complaint  Patient presents with  . Nausea     (Consider location/radiation/quality/duration/timing/severity/associated sxs/prior Treatment) HPI Patient had vomiting and diarrhea that began this morning. She states she vomited multiple times this morning. She then began to get pain in her right lower abdomen. She also states she's had several episodes of diarrhea but does not quantify how much. For she has begun to feel weak and lightheaded. No fever that she knows of. Denies pain burning or urgency. Urination. She denies abnormal vaginal bleeding or discharge. History reviewed. No pertinent past medical history. Past Surgical History  Procedure Laterality Date  . Cesarean section     History reviewed. No pertinent family history. Social History  Substance Use Topics  . Smoking status: Former Smoker    Types: Cigarettes  . Smokeless tobacco: None  . Alcohol Use: No   OB History    Gravida Para Term Preterm AB TAB SAB Ectopic Multiple Living   2              Review of Systems 10 Systems reviewed and are negative for acute change except as noted in the HPI.    Allergies  Review of patient's allergies indicates no known allergies.  Home Medications   Prior to Admission medications   Medication Sig Start Date End Date Taking? Authorizing Provider  Doxylamine-Pyridoxine 10-10 MG TBEC Take 1 tablet by mouth as directed. Initial: Two tablets at bedtime on day 1 and 2; if symptoms persist, take 1 tablet in morning and 2 tablets at bedtime on day 3; if symptoms persist, may increase to 1 tablet in morning, 1 tablet mid-afternoon, and 2 tablets at bedtime on day 4 (maximum: doxylamine 40 mg/pyridoxine 40 mg (4 tablets) per day). 01/15/16   Kristen N Ward, DO  ondansetron (ZOFRAN ODT) 4 MG disintegrating tablet Take 1-2 tablets (4-8 mg total) by mouth every 8  (eight) hours as needed for nausea or vomiting. 01/15/16   Kristen N Ward, DO  ondansetron (ZOFRAN ODT) 4 MG disintegrating tablet Take 1 tablet (4 mg total) by mouth every 4 (four) hours as needed for nausea or vomiting. 05/10/16   Arby Barrette, MD  promethazine (PHENERGAN) 25 MG suppository Place 1 suppository (25 mg total) rectally every 6 (six) hours as needed for nausea or vomiting. 01/15/16   Kristen N Ward, DO   BP 124/72 mmHg  Pulse 92  Temp(Src) 98.4 F (36.9 C) (Oral)  Resp 18  Ht  (1.651 m)  Wt 164 lb (74.39 kg)  BMI 27.29 kg/m2  SpO2 100%  LMP 10/17/2015  Breastfeeding? Unknown Physical Exam  Constitutional: She is oriented to person, place, and time. She appears well-developed and well-nourished.  Patient appears moderately ill and uncomfortable. No respite or distress. Alert and nontoxic.  HENT:  Head: Normocephalic and atraumatic.  Mouth/Throat: Oropharynx is clear and moist.  Eyes: EOM are normal. Pupils are equal, round, and reactive to light.  Neck: Neck supple.  Cardiovascular: Normal rate, regular rhythm, normal heart sounds and intact distal pulses.   Pulmonary/Chest: Effort normal and breath sounds normal.  Abdominal: Soft. Bowel sounds are normal. She exhibits no distension. There is tenderness.  Moderate to severe right lateral and right lower quadrant tenderness to palpation.  Musculoskeletal: Normal range of motion. She exhibits no edema or tenderness.  Neurological: She is alert and  oriented to person, place, and time. She has normal strength. Coordination normal. GCS eye subscore is 4. GCS verbal subscore is 5. GCS motor subscore is 6.  Skin: Skin is warm, dry and intact.  Psychiatric: She has a normal mood and affect.    ED Course  Procedures (including critical care time) Labs Review Labs Reviewed  URINALYSIS, ROUTINE W REFLEX MICROSCOPIC (NOT AT [facility]) - Abnormal; Notable for the following:    Hgb urine dipstick MODERATE (*)    Ketones, ur 40  (*)    All other components within normal limits  COMPREHENSIVE METABOLIC PANEL - Abnormal; Notable for the following:    CO2 17 (*)    Glucose, Bld 135 (*)    Total Protein 8.2 (*)    All other components within normal limits  CBC - Abnormal; Notable for the following:    WBC 20.0 (*)    RBC 5.12 (*)    All other components within normal limits  URINE MICROSCOPIC-ADD ON - Abnormal; Notable for the following:    Squamous Epithelial / LPF 0-5 (*)    Bacteria, UA RARE (*)    All other components within normal limits  PREGNANCY, URINE  LIPASE, BLOOD    Imaging Review No results found. I have personally reviewed and evaluated these images and lab results as part of my medical decision-making.   EKG Interpretation None      MDM   Final diagnoses:  Abdominal pain  Non-intractable vomiting with nausea, vomiting of unspecified type  Diarrhea, unspecified type  Dehydration   Patient had recurrent nausea and vomiting. She also reports abdominal pain. She does have significant leukocytosis. Rehydration has been initiated. Patient will get CT to make sure there is no surgical etiology such as appendicitis. Pending results I do feel patient will be safe for discharge with gastroenteritis if she has significant improvement after completing hydration status. Discharge instructions are provided.    Arby BarretteMarcy Marcayla Budge, MD 05/12/16 1536

## 2016-05-09 NOTE — ED Notes (Signed)
Patient states that she feels weak and lightheaded, told the staff at the front desk that she feels like she is going to pass out. The patient assured that she will go back as soon as a room is ready.

## 2016-05-10 MED ORDER — MORPHINE SULFATE (PF) 4 MG/ML IV SOLN
4.0000 mg | Freq: Once | INTRAVENOUS | Status: AC
  Administered 2016-05-10: 4 mg via INTRAVENOUS
  Filled 2016-05-10: qty 1

## 2016-05-10 MED ORDER — ONDANSETRON 4 MG PO TBDP: 4.0000 mg | ORAL_TABLET | ORAL | Status: DC | PRN

## 2016-05-10 NOTE — ED Notes (Signed)
Patient back from CT  - patient sleeping in room at this time

## 2016-05-10 NOTE — ED Notes (Signed)
Patient ambulatory to the restroom waiting on ride to come. The patient is awake alert and oriented and states that she feels much better.

## 2016-05-10 NOTE — Discharge Instructions (Signed)

## 2016-06-02 ENCOUNTER — Emergency Department (HOSPITAL_BASED_OUTPATIENT_CLINIC_OR_DEPARTMENT_OTHER): Payer: Medicaid Other

## 2016-06-02 ENCOUNTER — Encounter (HOSPITAL_BASED_OUTPATIENT_CLINIC_OR_DEPARTMENT_OTHER): Payer: Self-pay | Admitting: *Deleted

## 2016-06-02 ENCOUNTER — Encounter (HOSPITAL_BASED_OUTPATIENT_CLINIC_OR_DEPARTMENT_OTHER): Payer: Self-pay

## 2016-06-02 ENCOUNTER — Emergency Department (HOSPITAL_BASED_OUTPATIENT_CLINIC_OR_DEPARTMENT_OTHER)
Admission: EM | Admit: 2016-06-02 | Discharge: 2016-06-02 | Disposition: A | Discharge status: Home / Self Care | Payer: Medicaid Other | Attending: Emergency Medicine | Admitting: Emergency Medicine

## 2016-06-02 ENCOUNTER — Observation Stay (HOSPITAL_BASED_OUTPATIENT_CLINIC_OR_DEPARTMENT_OTHER)
Admission: AD | Admit: 2016-06-02 | Discharge: 2016-06-03 | Disposition: A | Discharge status: Home / Self Care | Payer: Medicaid Other | Source: Ambulatory Visit | Attending: Obstetrics & Gynecology | Admitting: Obstetrics & Gynecology

## 2016-06-02 DIAGNOSIS — O219 Vomiting of pregnancy, unspecified: Secondary | ICD-10-CM

## 2016-06-02 DIAGNOSIS — R112 Nausea with vomiting, unspecified: Secondary | ICD-10-CM

## 2016-06-02 DIAGNOSIS — O9989 Other specified diseases and conditions complicating pregnancy, childbirth and the puerperium: Secondary | ICD-10-CM | POA: Insufficient documentation

## 2016-06-02 DIAGNOSIS — Z3490 Encounter for supervision of normal pregnancy, unspecified, unspecified trimester: Secondary | ICD-10-CM

## 2016-06-02 DIAGNOSIS — R197 Diarrhea, unspecified: Secondary | ICD-10-CM | POA: Insufficient documentation

## 2016-06-02 DIAGNOSIS — R1030 Lower abdominal pain, unspecified: Secondary | ICD-10-CM | POA: Diagnosis not present

## 2016-06-02 DIAGNOSIS — Z3A01 Less than 8 weeks gestation of pregnancy: Secondary | ICD-10-CM | POA: Insufficient documentation

## 2016-06-02 DIAGNOSIS — O34219 Maternal care for unspecified type scar from previous cesarean delivery: Secondary | ICD-10-CM | POA: Diagnosis not present

## 2016-06-02 DIAGNOSIS — Z87891 Personal history of nicotine dependence: Secondary | ICD-10-CM | POA: Insufficient documentation

## 2016-06-02 DIAGNOSIS — R1110 Vomiting, unspecified: Secondary | ICD-10-CM | POA: Diagnosis present

## 2016-06-02 DIAGNOSIS — O210 Mild hyperemesis gravidarum: Secondary | ICD-10-CM | POA: Diagnosis not present

## 2016-06-02 LAB — COMPREHENSIVE METABOLIC PANEL
ALBUMIN: 4.2 g/dL (ref 3.5–5.0)
ALT: 11 U/L — ABNORMAL LOW (ref 14–54)
ANION GAP: 9 (ref 5–15)
AST: 19 U/L (ref 15–41)
Alkaline Phosphatase: 40 U/L (ref 38–126)
BUN: 8 mg/dL (ref 6–20)
CALCIUM: 9.6 mg/dL (ref 8.9–10.3)
CHLORIDE: 107 mmol/L (ref 101–111)
CO2: 22 mmol/L (ref 22–32)
Creatinine, Ser: 0.61 mg/dL (ref 0.44–1.00)
GFR calc non Af Amer: >60 mL/min (ref >60)
GLUCOSE: 116 mg/dL — AB (ref 65–99)
POTASSIUM: 3.4 mmol/L — AB (ref 3.5–5.1)
SODIUM: 138 mmol/L (ref 135–145)
Total Bilirubin: 0.5 mg/dL (ref 0.3–1.2)
Total Protein: 7.9 g/dL (ref 6.5–8.1)

## 2016-06-02 LAB — CBC WITH DIFFERENTIAL/PLATELET
BASOS PCT: 0 %
Basophils Absolute: 0 10*3/uL (ref 0.0–0.1)
EOS ABS: 0 10*3/uL (ref 0.0–0.7)
EOS PCT: 0 %
HCT: 37.6 % (ref 36.0–46.0)
Hemoglobin: 13.1 g/dL (ref 12.0–15.0)
LYMPHS ABS: 2.2 10*3/uL (ref 0.7–4.0)
Lymphocytes Relative: 20 %
MCH: 27.4 pg (ref 26.0–34.0)
MCHC: 34.8 g/dL (ref 30.0–36.0)
MCV: 78.7 fL (ref 78.0–100.0)
MONOS PCT: 4 %
Monocytes Absolute: 0.5 10*3/uL (ref 0.1–1.0)
NEUTROS PCT: 76 %
Neutro Abs: 8.5 10*3/uL — ABNORMAL HIGH (ref 1.7–7.7)
PLATELETS: 276 10*3/uL (ref 150–400)
RBC: 4.78 MIL/uL (ref 3.87–5.11)
RDW: 13.4 % (ref 11.5–15.5)
WBC: 11.3 10*3/uL — ABNORMAL HIGH (ref 4.0–10.5)

## 2016-06-02 LAB — URINALYSIS, ROUTINE W REFLEX MICROSCOPIC
Bilirubin Urine: NEGATIVE
Glucose, UA: NEGATIVE mg/dL
Hgb urine dipstick: NEGATIVE
KETONES UR: NEGATIVE mg/dL
LEUKOCYTES UA: NEGATIVE
NITRITE: NEGATIVE
PROTEIN: NEGATIVE mg/dL
SPECIFIC GRAVITY, URINE: 1.015 (ref 1.005–1.030)
pH: 8.5 — ABNORMAL HIGH (ref 5.0–8.0)

## 2016-06-02 LAB — HCG, QUANTITATIVE, PREGNANCY: hCG, Beta Chain, Quant, S: 50597 m[IU]/mL — ABNORMAL HIGH (ref <5)

## 2016-06-02 LAB — PREGNANCY, URINE: PREG TEST UR: POSITIVE — AB

## 2016-06-02 LAB — LIPASE, BLOOD: Lipase: 18 U/L (ref 11–51)

## 2016-06-02 MED ORDER — FAMOTIDINE IN NACL 20-0.9 MG/50ML-% IV SOLN
20.0000 mg | Freq: Once | INTRAVENOUS | Status: AC
  Administered 2016-06-02: 20 mg via INTRAVENOUS
  Filled 2016-06-02: qty 50

## 2016-06-02 MED ORDER — ONDANSETRON HCL 4 MG/2ML IJ SOLN
4.0000 mg | Freq: Once | INTRAMUSCULAR | Status: AC
  Administered 2016-06-02: 4 mg via INTRAVENOUS
  Filled 2016-06-02: qty 2

## 2016-06-02 MED ORDER — METOCLOPRAMIDE HCL 5 MG/ML IJ SOLN
10.0000 mg | Freq: Once | INTRAMUSCULAR | Status: AC
  Administered 2016-06-02: 10 mg via INTRAVENOUS
  Filled 2016-06-02: qty 2

## 2016-06-02 MED ORDER — SODIUM CHLORIDE 0.9 % IV SOLN
1000.0000 mL | INTRAVENOUS | Status: DC
  Administered 2016-06-02: 1000 mL via INTRAVENOUS

## 2016-06-02 MED ORDER — DOXYLAMINE-PYRIDOXINE 10-10 MG PO TBEC: DELAYED_RELEASE_TABLET | ORAL | Status: DC

## 2016-06-02 MED ORDER — ACETAMINOPHEN 500 MG PO TABS
1000.0000 mg | ORAL_TABLET | Freq: Once | ORAL | Status: AC
  Administered 2016-06-02: 1000 mg via ORAL
  Filled 2016-06-02: qty 2

## 2016-06-02 MED ORDER — ACETAMINOPHEN 650 MG RE SUPP
650.0000 mg | Freq: Once | RECTAL | Status: AC
  Administered 2016-06-02: 650 mg via RECTAL
  Filled 2016-06-02: qty 1

## 2016-06-02 MED ORDER — DICYCLOMINE HCL 10 MG/ML IM SOLN
20.0000 mg | Freq: Once | INTRAMUSCULAR | Status: AC
  Administered 2016-06-02: 20 mg via INTRAMUSCULAR
  Filled 2016-06-02: qty 2

## 2016-06-02 MED ORDER — METOCLOPRAMIDE HCL 10 MG PO TABS: 10.0000 mg | ORAL_TABLET | Freq: Three times a day (TID) | ORAL | Status: DC | PRN

## 2016-06-02 MED ORDER — SODIUM CHLORIDE 0.9 % IV BOLUS (SEPSIS)
1000.0000 mL | Freq: Once | INTRAVENOUS | Status: AC
  Administered 2016-06-02: 1000 mL via INTRAVENOUS

## 2016-06-02 MED ORDER — SODIUM CHLORIDE 0.9 % IV SOLN
1000.0000 mL | Freq: Once | INTRAVENOUS | Status: AC
  Administered 2016-06-02: 1000 mL via INTRAVENOUS

## 2016-06-02 MED ORDER — DIPHENHYDRAMINE HCL 50 MG/ML IJ SOLN
25.0000 mg | Freq: Once | INTRAMUSCULAR | Status: AC
  Administered 2016-06-02: 25 mg via INTRAVENOUS
  Filled 2016-06-02: qty 1

## 2016-06-02 MED ORDER — ACETAMINOPHEN 500 MG PO TABS
1000.0000 mg | ORAL_TABLET | Freq: Once | ORAL | Status: DC
  Filled 2016-06-02: qty 2

## 2016-06-02 NOTE — ED Notes (Signed)
Unable to give tylenol pt is in U/s.

## 2016-06-02 NOTE — ED Notes (Signed)
C/o abd cramps, n/v/d, chills x 2 days. Cramps are mid abd.  White vaginal discharge. No urinary sx.

## 2016-06-02 NOTE — Discharge Instructions (Signed)
You were seen and evaluated today for your nausea and vomiting. Likely this is related to a viral infection that you have contracted this causing a stomach flu. On top of this you are also found to be 6 weeks and 2 days pregnant which is likely causing increased nausea and vomiting. Please use the medications prescribed and keep yourself well-hydrated. Make a follow-up appointment as soon as possible with an OB/GYN.  Nausea and Vomiting Nausea is a sick feeling that often comes before throwing up (vomiting). Vomiting is a reflex where stomach contents come out of your mouth. Vomiting can cause severe loss of body fluids (dehydration). Children and elderly adults can become dehydrated quickly, especially if they also have diarrhea. Nausea and vomiting are symptoms of a condition or disease. It is important to find the cause of your symptoms. CAUSES   Direct irritation of the stomach lining. This irritation can result from increased acid production (gastroesophageal reflux disease), infection, food poisoning, taking certain medicines (such as nonsteroidal anti-inflammatory drugs), alcohol use, or tobacco use.  Signals from the brain.These signals could be caused by a headache, heat exposure, an inner ear disturbance, increased pressure in the brain from injury, infection, a tumor, or a concussion, pain, emotional stimulus, or metabolic problems.  An obstruction in the gastrointestinal tract (bowel obstruction).  Illnesses such as diabetes, hepatitis, gallbladder problems, appendicitis, kidney problems, cancer, sepsis, atypical symptoms of a heart attack, or eating disorders.  Medical treatments such as chemotherapy and radiation.  Receiving medicine that makes you sleep (general anesthetic) during surgery. DIAGNOSIS Your caregiver may ask for tests to be done if the problems do not improve after a few days. Tests may also be done if symptoms are severe or if the reason for the nausea and vomiting is  not clear. Tests may include:  Urine tests.  Blood tests.  Stool tests.  Cultures (to look for evidence of infection).  X-rays or other imaging studies. Test results can help your caregiver make decisions about treatment or the need for additional tests. TREATMENT You need to stay well hydrated. Drink frequently but in small amounts.You may wish to drink water, sports drinks, clear broth, or eat frozen ice pops or gelatin dessert to help stay hydrated.When you eat, eating slowly may help prevent nausea.There are also some antinausea medicines that may help prevent nausea. HOME CARE INSTRUCTIONS   Take all medicine as directed by your caregiver.  If you do not have an appetite, do not force yourself to eat. However, you must continue to drink fluids.  If you have an appetite, eat a normal diet unless your caregiver tells you differently.  Eat a variety of complex carbohydrates (rice, wheat, potatoes, bread), lean meats, yogurt, fruits, and vegetables.  Avoid high-fat foods because they are more difficult to digest.  Drink enough water and fluids to keep your urine clear or pale yellow.  If you are dehydrated, ask your caregiver for specific rehydration instructions. Signs of dehydration may include:  Severe thirst.  Dry lips and mouth.  Dizziness.  Dark urine.  Decreasing urine frequency and amount.  Confusion.  Rapid breathing or pulse. SEEK IMMEDIATE MEDICAL CARE IF:   You have blood or brown flecks (like coffee grounds) in your vomit.  You have black or bloody stools.  You have a severe headache or stiff neck.  You are confused.  You have severe abdominal pain.  You have chest pain or trouble breathing.  You do not urinate at least once every 8  hours.  You develop cold or clammy skin.  You continue to vomit for longer than 24 to 48 hours.  You have a fever. MAKE SURE YOU:   Understand these instructions.  Will watch your condition.  Will get  help right away if you are not doing well or get worse.   This information is not intended to replace advice given to you by your health care provider. Make sure you discuss any questions you have with your health care provider.   Document Released: 12/06/2005 Document Revised: 02/28/2012 Document Reviewed: 05/05/2011 Elsevier Interactive Patient Education [employer]   First Trimester of Pregnancy The first trimester of pregnancy is from week 1 until the end of week 12 (months 1 through 3). A week after a sperm fertilizes an egg, the egg will implant on the wall of the uterus. This embryo will begin to develop into a baby. Genes from you and your partner are forming the baby. The female genes determine whether the baby is a boy or a girl. At 6-8 weeks, the eyes and face are formed, and the heartbeat can be seen on ultrasound. At the end of 12 weeks, all the baby's organs are formed.  Now that you are pregnant, you will want to do everything you can to have a healthy baby. Two of the most important things are to get good prenatal care and to follow your health care provider's instructions. Prenatal care is all the medical care you receive before the baby's birth. This care will help prevent, find, and treat any problems during the pregnancy and childbirth. BODY CHANGES Your body goes through many changes during pregnancy. The changes vary from woman to woman.   You may gain or lose a couple of pounds at first.  You may feel sick to your stomach (nauseous) and throw up (vomit). If the vomiting is uncontrollable, call your health care provider.  You may tire easily.  You may develop headaches that can be relieved by medicines approved by your health care provider.  You may urinate more often. Painful urination may mean you have a bladder infection.  You may develop heartburn as a result of your pregnancy.  You may develop constipation because certain hormones are causing the muscles  that push waste through your intestines to slow down.  You may develop hemorrhoids or swollen, bulging veins (varicose veins).  Your breasts may begin to grow larger and become tender. Your nipples may stick out more, and the tissue that surrounds them (areola) may become darker.  Your gums may bleed and may be sensitive to brushing and flossing.  Dark spots or blotches (chloasma, mask of pregnancy) may develop on your face. This will likely fade after the baby is born.  Your menstrual periods will stop.  You may have a loss of appetite.  You may develop cravings for certain kinds of food.  You may have changes in your emotions from day to day, such as being excited to be pregnant or being concerned that something may go wrong with the pregnancy and baby.  You may have more vivid and strange dreams.  You may have changes in your hair. These can include thickening of your hair, rapid growth, and changes in texture. Some women also have hair loss during or after pregnancy, or hair that feels dry or thin. Your hair will most likely return to normal after your baby is born. WHAT TO EXPECT AT YOUR PRENATAL VISITS During a routine prenatal visit:  You will be weighed to make sure you and the baby are growing normally.  Your blood pressure will be taken.  Your abdomen will be measured to track your baby's growth.  The fetal heartbeat will be listened to starting around week 10 or 12 of your pregnancy.  Test results from any previous visits will be discussed. Your health care provider may ask you:  How you are feeling.  If you are feeling the baby move.  If you have had any abnormal symptoms, such as leaking fluid, bleeding, severe headaches, or abdominal cramping.  If you are using any tobacco products, including cigarettes, chewing tobacco, and electronic cigarettes.  If you have any questions. Other tests that may be performed during your first trimester include:  Blood tests  to find your blood type and to check for the presence of any previous infections. They will also be used to check for low iron levels (anemia) and Rh antibodies. Later in the pregnancy, blood tests for diabetes will be done along with other tests if problems develop.  Urine tests to check for infections, diabetes, or protein in the urine.  An ultrasound to confirm the proper growth and development of the baby.  An amniocentesis to check for possible genetic problems.  Fetal screens for spina bifida and Down syndrome.  You may need other tests to make sure you and the baby are doing well.  HIV (human immunodeficiency virus) testing. Routine prenatal testing includes screening for HIV, unless you choose not to have this test. HOME CARE INSTRUCTIONS  Medicines  Follow your health care provider's instructions regarding medicine use. Specific medicines may be either safe or unsafe to take during pregnancy.  Take your prenatal vitamins as directed.  If you develop constipation, try taking a stool softener if your health care provider approves. Diet  Eat regular, well-balanced meals. Choose a variety of foods, such as meat or vegetable-based protein, fish, milk and low-fat dairy products, vegetables, fruits, and whole grain breads and cereals. Your health care provider will help you determine the amount of weight gain that is right for you.  Avoid raw meat and uncooked cheese. These carry germs that can cause birth defects in the baby.  Eating four or five small meals rather than three large meals a day may help relieve nausea and vomiting. If you start to feel nauseous, eating a few soda crackers can be helpful. Drinking liquids between meals instead of during meals also seems to help nausea and vomiting.  If you develop constipation, eat more high-fiber foods, such as fresh vegetables or fruit and whole grains. Drink enough fluids to keep your urine clear or pale yellow. Activity and  Exercise  Exercise only as directed by your health care provider. Exercising will help you:  Control your weight.  Stay in shape.  Be prepared for labor and delivery.  Experiencing pain or cramping in the lower abdomen or low back is a good sign that you should stop exercising. Check with your health care provider before continuing normal exercises.  Try to avoid standing for long periods of time. Move your legs often if you must stand in one place for a long time.  Avoid heavy lifting.  Wear low-heeled shoes, and practice good posture.  You may continue to have sex unless your health care provider directs you otherwise. Relief of Pain or Discomfort  Wear a good support bra for breast tenderness.   Take warm sitz baths to soothe any pain or discomfort caused  by hemorrhoids. Use hemorrhoid cream if your health care provider approves.   Rest with your legs elevated if you have leg cramps or low back pain.  If you develop varicose veins in your legs, wear support hose. Elevate your feet for 15 minutes, 3-4 times a day. Limit salt in your diet. Prenatal Care  Schedule your prenatal visits by the twelfth week of pregnancy. They are usually scheduled monthly at first, then more often in the last 2 months before delivery.  Write down your questions. Take them to your prenatal visits.  Keep all your prenatal visits as directed by your health care provider. Safety  Wear your seat belt at all times when driving.  Make a list of emergency phone numbers, including numbers for family, friends, the hospital, and police and fire departments. General Tips  Ask your health care provider for a referral to a local prenatal education class. Begin classes no later than at the beginning of month 6 of your pregnancy.  Ask for help if you have counseling or nutritional needs during pregnancy. Your health care provider can offer advice or refer you to specialists for help with various  needs.  Do not use hot tubs, steam rooms, or saunas.  Do not douche or use tampons or scented sanitary pads.  Do not cross your legs for long periods of time.  Avoid cat litter boxes and soil used by cats. These carry germs that can cause birth defects in the baby and possibly loss of the fetus by miscarriage or stillbirth.  Avoid all smoking, herbs, alcohol, and medicines not prescribed by your health care provider. Chemicals in these affect the formation and growth of the baby.  Do not use any tobacco products, including cigarettes, chewing tobacco, and electronic cigarettes. If you need help quitting, ask your health care provider. You may receive counseling support and other resources to help you quit.  Schedule a dentist appointment. At home, brush your teeth with a soft toothbrush and be gentle when you floss. SEEK MEDICAL CARE IF:   You have dizziness.  You have mild pelvic cramps, pelvic pressure, or nagging pain in the abdominal area.  You have persistent nausea, vomiting, or diarrhea.  You have a bad smelling vaginal discharge.  You have pain with urination.  You notice increased swelling in your face, hands, legs, or ankles. SEEK IMMEDIATE MEDICAL CARE IF:   You have a fever.  You are leaking fluid from your vagina.  You have spotting or bleeding from your vagina.  You have severe abdominal cramping or pain.  You have rapid weight gain or loss.  You vomit blood or material that looks like coffee grounds.  You are exposed to [place] measles and have never had them.  You are exposed to fifth disease or chickenpox.  You develop a severe headache.  You have shortness of breath.  You have any kind of trauma, such as from a fall or a car accident.   This information is not intended to replace advice given to you by your health care provider. Make sure you discuss any questions you have with your health care provider.   Document Released: 11/30/2001 Document  Revised: 12/27/2014 Document Reviewed: 10/16/2013 Elsevier Interactive Patient Education [employer]

## 2016-06-02 NOTE — ED Notes (Signed)
Pt given d/c instructions as per chart. Rx x 2. Verbalizes understanding. No questions. 

## 2016-06-02 NOTE — ED Notes (Signed)
Pt back from U/S and pt is gagging into emesis bag. MD aware.

## 2016-06-02 NOTE — ED Notes (Signed)
Pt and her significant other have asked if [facility] and not wait for [facility].  Dr. Donnald GarrePfeiffer is OK with this plan.  IV will be removed and he will drive her to [facility].

## 2016-06-02 NOTE — ED Notes (Signed)
Pt has small amt of fluid approx 50cc in emesis bag. Pt requesting more nausea med. Will make MD aware.

## 2016-06-02 NOTE — ED Notes (Signed)
Pt resting quietly on left side at this time with eyes closed.  Awake.  No gagging or vomiting at this time.

## 2016-06-02 NOTE — ED Provider Notes (Signed)
CSN: [id number]     Arrival date & time 06/02/16  0865 History   First MD Initiated Contact with Patient 06/02/16 [id number]     No chief complaint on file.    (Consider location/radiation/quality/duration/timing/severity/associated sxs/prior Treatment) HPI Comments: 25 y.o. Female presents for nausea, vomiting, and diarrhea.  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  Patient denies fever, chills.  Reports cramping along the lower abdomen.  Reports normal urinary patterns.  Patient reports that she has not been able to hold down food or drink over the last few days.   History reviewed. No pertinent past medical history. Past Surgical History  Procedure Laterality Date  . Cesarean section     No family history on file. Social History  Substance Use Topics  . Smoking status: Former Smoker    Types: Cigarettes  . Smokeless tobacco: None  . Alcohol Use: No   OB History    Gravida Para Term Preterm AB TAB SAB Ectopic Multiple Living   2              Review of Systems  Constitutional: Positive for appetite change and fatigue. Negative for fever.  HENT: Negative for congestion, ear discharge and postnasal drip.   Eyes: Negative for visual disturbance.  Respiratory: Negative for cough, chest tightness, shortness of breath and wheezing.   Cardiovascular: Negative for chest pain and palpitations.  Gastrointestinal: Positive for nausea, vomiting, abdominal pain (cramping along lower abdomen) and diarrhea. Negative for constipation.  Genitourinary: Negative for dysuria, urgency, frequency, flank pain, decreased urine volume and pelvic pain.  Musculoskeletal: Negative for myalgias and back pain.  Skin: Negative for rash and wound.  Neurological: Negative for dizziness, weakness, light-headedness and headaches.  Hematological: Does not bruise/bleed easily.       Allergies  Review of patient's allergies indicates no known allergies.  Home Medications   Prior to Admission medications   Not on File   BP 135/75 mmHg  Pulse 82  Temp(Src) 98.3 F (36.8 C) (Oral)  Resp 18  Ht  (1.651 m)  Wt 164 lb (74.39 kg)  BMI 27.29 kg/m2  SpO2 100%  LMP 05/17/2016 Physical Exam  Constitutional: She is oriented to person, place, and time. She appears well-developed and well-nourished. No distress.  HENT:  Head: Normocephalic and atraumatic.  Right Ear: External ear normal.  Left Ear: External ear normal.  Nose: Nose normal.  Mouth/Throat: Oropharynx is clear and moist. Mucous membranes are dry. No oropharyngeal exudate.  Eyes: EOM are normal. Pupils are equal, round, and reactive to light.  Neck: Normal range of motion. Neck supple.  Cardiovascular: Normal rate, regular rhythm, normal heart sounds and intact distal pulses.   No murmur heard. Pulmonary/Chest: Effort normal. No respiratory distress. She has no wheezes. She has no rales.  Abdominal: Soft. She exhibits no distension. There is no tenderness (no focal tenderness although reports cramping along the lower abdomen and discomfort along this same area).  Musculoskeletal: Normal range of motion. She exhibits no edema or tenderness.  Neurological: She is alert and oriented to person, place, and time.  Skin: Skin is warm and dry. No rash noted. She is not diaphoretic.  Vitals reviewed.   ED Course  Procedures (including critical care time) Labs Review Labs Reviewed  CBC WITH DIFFERENTIAL/PLATELET  COMPREHENSIVE METABOLIC PANEL  LIPASE, BLOOD  URINALYSIS, ROUTINE W REFLEX MICROSCOPIC (  NOT AT [facility])  PREGNANCY, URINE    Imaging Review No results found. I have personally reviewed and evaluated these images and lab results as part of my medical decision-making.   EKG Interpretation None      MDM  Patient was seen and evalauted in stable condition.  Patient had episodes of  emesis in the emergency department and was retching several times.  She was given Zofran and IV fluids initially without significant improvement.  Because of on going symptoms initially CT was ordered but d/c'd after pregnancy positive and reevaluation of abdominal examinaiton and patient remained without point tenderness.  US showed IUP at 6 weeks and 2 days.  Patient was given additional antiemetics.  Labs without sign of significant dehydration or ketones in urine.  Patinet and significant other felt comfortable with discharge at this time.  Patient discharged with prescriptions for antiemetics at home, instruction to follow up with OBGYN and with strict return precautions. Final diagnoses:  None    1. Vomiting  2. Diarrhea  3. Early pregnancy    Leta BaptistEmily Roe Nguyen, MD 06/04/16 1436

## 2016-06-02 NOTE — ED Provider Notes (Signed)
CSN: [id number]     Arrival date & time 06/02/16  1813 History   First MD Initiated Contact with Patient 06/02/16 1902     Chief Complaint  Patient presents with  . Emesis     (Consider location/radiation/quality/duration/timing/severity/associated sxs/prior Treatment) HPI Patient has had recurrent vomiting for approximately 2 days. She reports she is also getting occasional diarrhea and cramping lower abdominal pain. No associated fever. No pain burning or urgency. Urination. Minimal white vaginal discharge. No bleeding. Patient had been treated in the room she department earlier in the afternoon. She had been rehydrated and given Reglan, Benadryl and Zofran for nausea and vomiting. Patient also had pelvic ultrasound done. She reports she temporally felt better but associated at home she started vomiting again and having dry heaves. He has not been able to keep anything down and has not yet filled her medications. History reviewed. No pertinent past medical history. Past Surgical History  Procedure Laterality Date  . Cesarean section     No family history on file. Social History  Substance Use Topics  . Smoking status: Former Smoker    Types: Cigarettes  . Smokeless tobacco: None  . Alcohol Use: No   OB History    Gravida Para Term Preterm AB TAB SAB Ectopic Multiple Living   2              Review of Systems  10 Systems reviewed and are negative for acute change except as noted in the HPI.   Allergies  Review of patient's allergies indicates no known allergies.  Home Medications   Prior to Admission medications   Medication Sig Start Date End Date Taking? Authorizing Provider  Doxylamine-Pyridoxine 10-10 MG TBEC Take two tablets tonight at bedtime and two tomorrow at bedtime.  If symptoms persist and are not controlled then you may increased to 1 tablet in the morning and two at bedtime.  If after 3 days symptoms are still not controlled then you may take 1 tablet in the  morning, 1 at mid day, and 2 at bedtime. 06/02/16   Leta Baptist, MD  metoCLOPramide (REGLAN) 10 MG tablet Take 1 tablet (10 mg total) by mouth every 8 (eight) hours as needed for nausea or vomiting. 06/02/16   Leta Baptist, MD   BP 137/77 mmHg  Pulse 89  Temp(Src) 99.7 F (37.6 C) (Oral)  Resp 20  Ht  (1.651 m)  Wt 164 lb (74.39 kg)  BMI 27.29 kg/m2  SpO2 100%  LMP 05/17/2016 Physical Exam  Constitutional: She is oriented to person, place, and time. She appears well-developed and well-nourished.  Patient appears uncomfortable. She is alert and nontoxic. No respiratory distress.  HENT:  Head: Normocephalic and atraumatic.  Eyes: EOM are normal. Pupils are equal, round, and reactive to light.  Neck: Neck supple.  Cardiovascular: Normal rate, regular rhythm, normal heart sounds and intact distal pulses.   Pulmonary/Chest: Effort normal and breath sounds normal.  Abdominal: Soft. Bowel sounds are normal. She exhibits no distension. There is no tenderness.  Musculoskeletal: Normal range of motion. She exhibits no edema.  Neurological: She is alert and oriented to person, place, and time. She has normal strength. Coordination normal. GCS eye subscore is 4. GCS verbal subscore is 5. GCS motor subscore is 6.  Skin: Skin is warm, dry and intact.  Psychiatric: She has a normal mood and affect.    ED Course  Procedures (including critical care time) Labs Review Labs Reviewed - No data  to display  Imaging Review [place] Ob Comp Less 14 Wks  06/02/2016  CLINICAL DATA:  Low abdominal pain. Positive urine pregnancy test. LMP 05/17/2016 EXAM: OBSTETRIC <14 WK US AND TRANSVAGINAL OB US TECHNIQUE: Both transabdominal and transvaginal ultrasound examinations were performed for complete evaluation of the gestation as well as the maternal uterus, adnexal regions, and pelvic cul-de-sac. Transvaginal technique was performed to assess early pregnancy. COMPARISON:  Pelvic CT 05/10/2016.  Obstetric  ultrasound 12/22/2015. FINDINGS: Intrauterine gestational sac: Visualized/normal in shape. Yolk sac:  Visualized. Embryo:  Visualized. Cardiac Activity: Visualized. Heart Rate: 116  bpm CRL:  5  mm; 6 w 2 d;                  US EDC: 01/24/2017 Subchorionic hemorrhage: There is a small subchorionic hematoma. Maternal uterus/adnexae: Both maternal ovaries are visualized. No adnexal mass or significant free pelvic fluid. IMPRESSION: 1. Single live intrauterine pregnancy with best estimated gestational age of [redacted] weeks 2 days. 2. Small subchorionic hematoma. No adnexal abnormalities identified. Electronically Signed   By: Carey BullocksWilliam  Veazey M.D.   On: 06/02/2016 12:03   [place] Ob Transvaginal  06/02/2016  CLINICAL DATA:  Low abdominal pain. Positive urine pregnancy test. LMP 05/17/2016 EXAM: OBSTETRIC <14 WK US AND TRANSVAGINAL OB US TECHNIQUE: Both transabdominal and transvaginal ultrasound examinations were performed for complete evaluation of the gestation as well as the maternal uterus, adnexal regions, and pelvic cul-de-sac. Transvaginal technique was performed to assess early pregnancy. COMPARISON:  Pelvic CT 05/10/2016.  Obstetric ultrasound 12/22/2015. FINDINGS: Intrauterine gestational sac: Visualized/normal in shape. Yolk sac:  Visualized. Embryo:  Visualized. Cardiac Activity: Visualized. Heart Rate: 116  bpm CRL:  5  mm; 6 w 2 d;                  US EDC: 01/24/2017 Subchorionic hemorrhage: There is a small subchorionic hematoma. Maternal uterus/adnexae: Both maternal ovaries are visualized. No adnexal mass or significant free pelvic fluid. IMPRESSION: 1. Single live intrauterine pregnancy with best estimated gestational age of [redacted] weeks 2 days. 2. Small subchorionic hematoma. No adnexal abnormalities identified. Electronically Signed   By: Carey BullocksWilliam  Veazey M.D.   On: 06/02/2016 12:03   I have personally reviewed and evaluated these images and lab results as part of my medical decision-making.   EKG  Interpretation None     Consult: Reviewed with Dr. Despina HiddenEure [facility] management of intractable hyperemesis in early pregnancy. MDM   Final diagnoses:  Hyperemesis gravidarum   The patient has developed intractable symptoms of hyperemesis gravidarum. This is her second visit to the emergency department today. Repeat hydration and administration of Reglan have not alleviated symptoms. Patient continues to complain of nausea and dry heaves with some diffuse cramping lower abdominal pain. Abdominal examination is nonsurgical. She had ultrasound done at her first visit that confirmed IUP at approximate 6 weeks. At this time, she will be transferred (by private vehicle per the patient's request) to MAU.    Arby BarretteMarcy Kailana Benninger, MD 06/02/16 2330

## 2016-06-02 NOTE — ED Notes (Signed)
Pt states she has yet to take her prescriptions from this AM. Pt states she is unable to keep anything down at this time. Pt states she is G4T2A1L2 for her OB hx and was dx with PIH with her 1st 2.

## 2016-06-02 NOTE — ED Notes (Signed)
Pt was seen earlier today for n/v-returns with same c/o-states she went home took a shower and started vomiting again-pt hands are cramping in what looks like she was hyperventilating-states she was advised she is pregnant today-NAD-was assisted from car to w/c to triage-pt's car smelled of marijuana-pt denies use-pt's fiance with pt-after triage she yelled at him to not call any of her family

## 2016-06-03 ENCOUNTER — Encounter (HOSPITAL_COMMUNITY): Payer: Self-pay

## 2016-06-03 ENCOUNTER — Inpatient Hospital Stay (HOSPITAL_COMMUNITY)
Admission: AD | Admit: 2016-06-03 | Discharge: 2016-06-04 | Disposition: A | Discharge status: Home / Self Care | Payer: Medicaid Other | Source: Ambulatory Visit | Attending: Obstetrics & Gynecology | Admitting: Obstetrics & Gynecology

## 2016-06-03 ENCOUNTER — Encounter (HOSPITAL_COMMUNITY): Payer: Self-pay | Admitting: *Deleted

## 2016-06-03 DIAGNOSIS — Z87891 Personal history of nicotine dependence: Secondary | ICD-10-CM | POA: Insufficient documentation

## 2016-06-03 DIAGNOSIS — Z3A01 Less than 8 weeks gestation of pregnancy: Secondary | ICD-10-CM | POA: Diagnosis not present

## 2016-06-03 DIAGNOSIS — O219 Vomiting of pregnancy, unspecified: Secondary | ICD-10-CM

## 2016-06-03 DIAGNOSIS — O210 Mild hyperemesis gravidarum: Secondary | ICD-10-CM | POA: Diagnosis present

## 2016-06-03 LAB — URINALYSIS, ROUTINE W REFLEX MICROSCOPIC
BILIRUBIN URINE: NEGATIVE
Hgb urine dipstick: NEGATIVE
KETONES UR: 15 mg/dL — AB
LEUKOCYTES UA: NEGATIVE
NITRITE: NEGATIVE
PH: 7 (ref 5.0–8.0)
PROTEIN: NEGATIVE mg/dL
Specific Gravity, Urine: 1.01 (ref 1.005–1.030)

## 2016-06-03 LAB — URINE MICROSCOPIC-ADD ON

## 2016-06-03 MED ORDER — ONDANSETRON 8 MG PO TBDP: 8.0000 mg | ORAL_TABLET | Freq: Three times a day (TID) | ORAL | Status: DC | PRN

## 2016-06-03 MED ORDER — FAMOTIDINE IN NACL 20-0.9 MG/50ML-% IV SOLN
20.0000 mg | Freq: Once | INTRAVENOUS | Status: AC
  Administered 2016-06-03: 20 mg via INTRAVENOUS
  Filled 2016-06-03: qty 50

## 2016-06-03 MED ORDER — LACTATED RINGERS IV SOLN
INTRAVENOUS | Status: DC
  Administered 2016-06-03: 22:00:00 via INTRAVENOUS

## 2016-06-03 MED ORDER — PROMETHAZINE HCL 25 MG PO TABS: 12.5000 mg | ORAL_TABLET | Freq: Four times a day (QID) | ORAL | Status: DC | PRN

## 2016-06-03 MED ORDER — DEXTROSE IN LACTATED RINGERS 5 % IV SOLN
Freq: Once | INTRAVENOUS | Status: AC
  Administered 2016-06-03: 19:00:00 via INTRAVENOUS
  Filled 2016-06-03: qty 1000

## 2016-06-03 MED ORDER — METOCLOPRAMIDE HCL 5 MG/ML IJ SOLN
10.0000 mg | Freq: Once | INTRAMUSCULAR | Status: DC
  Filled 2016-06-03: qty 2

## 2016-06-03 MED ORDER — SODIUM CHLORIDE 0.9 % IV SOLN
8.0000 mg | Freq: Once | INTRAVENOUS | Status: AC
  Administered 2016-06-03: 8 mg via INTRAVENOUS
  Filled 2016-06-03: qty 4

## 2016-06-03 MED ORDER — LACTATED RINGERS IV BOLUS (SEPSIS)
1000.0000 mL | Freq: Once | INTRAVENOUS | Status: AC
  Administered 2016-06-03: 1000 mL via INTRAVENOUS

## 2016-06-03 MED ORDER — PROMETHAZINE HCL 25 MG/ML IJ SOLN
25.0000 mg | Freq: Once | INTRAMUSCULAR | Status: AC
  Administered 2016-06-03: 25 mg via INTRAVENOUS
  Filled 2016-06-03: qty 1

## 2016-06-03 MED ORDER — PROMETHAZINE HCL 25 MG/ML IJ SOLN
INTRAMUSCULAR | Status: DC
  Filled 2016-06-03 (×4): qty 1000

## 2016-06-03 NOTE — MAU Note (Signed)
Pt seen in MAU last night, was given prescription for phenergan.  Pt states she has used 4 doses of the phenergan - two orally & two rectally, has not helped, is still vomiting everything.  Has noted streaks of blood in emesis.  Has mid abdominal pain after vomiting.  Denies bleeding.

## 2016-06-03 NOTE — Discharge Instructions (Signed)

## 2016-06-03 NOTE — MAU Note (Signed)
Pt sent from [facility] for nausea and vomiting. Given 2L of IVF and IV antinausea medication-did not help.

## 2016-06-03 NOTE — MAU Provider Note (Signed)
History     CSN: [id number]  Arrival date and time: 06/03/16 1811   First Provider Initiated Contact with Patient 06/03/16 1907      Chief Complaint  Patient presents with  . Emesis During Pregnancy   HPI Pt is [redacted]w[redacted]d pregnant [id number] who returns to MAU after being seen last night.  Pt felt good when she went home after being in MAU and woke up nauseated and vomiting; tried phenergan PO and rectally.  She has had blood streaked vomit today. Pt has confirmed SLIUP by [place] yesterday. Pt denies spotting or bleeding but has some cramping after vomiting.  Pt feels horrible and discouraged because unable to care for her children Pt unable to get urine specimen on admission RN note:  Expand All Collapse All   Pt seen in MAU last night, was given prescription for phenergan. Pt states she has used 4 doses of the phenergan - two orally & two rectally, has not helped, is still vomiting everything. Has noted streaks of blood in emesis. Has mid abdominal pain after vomiting. Denies bleeding       Past Medical History  Diagnosis Date  . Medical history non-contributory     Past Surgical History  Procedure Laterality Date  . Cesarean section      History reviewed. No pertinent family history.  Social History  Substance Use Topics  . Smoking status: Former Smoker    Types: Cigarettes  . Smokeless tobacco: Never Used  . Alcohol Use: No    Allergies: No Known Allergies  Prescriptions prior to admission  Medication Sig Dispense Refill Last Dose  . promethazine (PHENERGAN) 25 MG tablet Take 0.5-1 tablets (12.5-25 mg total) by mouth every 6 (six) hours as needed. 30 tablet 1 06/03/2016 at Unknown time  . Doxylamine-Pyridoxine 10-10 MG TBEC Take two tablets tonight at bedtime and two tomorrow at bedtime.  If symptoms persist and are not controlled then you may increased to 1 tablet in the morning and two at bedtime.  If after 3 days symptoms are still not controlled then you may take 1  tablet in the morning, 1 at mid day, and 2 at bedtime. (Patient not taking: Reported on 06/03/2016) 60 tablet 2 Not Taking at Unknown time    Review of Systems  Constitutional: Negative for fever and chills.  Gastrointestinal: Positive for nausea, vomiting and abdominal pain. Negative for diarrhea and constipation.  Genitourinary: Negative for dysuria.   Physical Exam   Blood pressure 136/77, pulse 80, temperature 99.2 F (37.3 C), temperature source Oral, resp. rate 20, height  (1.651 m), weight 165 lb (74.844 kg), last menstrual period 04/17/2016, unknown if currently breastfeeding.  Physical Exam  Vitals reviewed. Constitutional: She is oriented to person, place, and time. She appears well-developed and well-nourished.  Uncomfortable appearing  HENT:  Head: Normocephalic.  Eyes: Pupils are equal, round, and reactive to light.  Neck: Normal range of motion. Neck supple.  Cardiovascular: Normal rate.   Respiratory: Effort normal.  GI: Soft.  Musculoskeletal: Normal range of motion.  Neurological: She is alert and oriented to person, place, and time.  Skin: Skin is warm and dry. There is pallor.  Psychiatric: She has a normal mood and affect.    MAU Course  Procedures IVF D5LR with phenergan  IV Pepcid  IVPB- pt feeling better then got up to the bathroom and  started feeling like she was going to vomit again Reglan 10 V ordered Care handed over to [facility], CNM  2349: Patient has had zofran She is tolerating PO at this time.  Assessment and Plan   1. Nausea/vomiting in pregnancy    DC home Comfort measures reviewed  1st Trimester precautions  RX: zofran PRN #[id number], continue phenergan PRN  Return to MAU as needed FU with OB as planned  Follow-up Information    Please follow up.   Contact information:   Your OBGYN as scheduled         LINEBERRY,SUSAN 06/03/2016, 8:42 PM

## 2016-06-03 NOTE — MAU Provider Note (Signed)
History     CSN: [id number]  Arrival date and time: 06/02/16 1813   First Provider Initiated Contact with Patient 06/03/16 0124      Chief Complaint  Patient presents with  . Emesis   Emesis  This is a new problem. Episode onset: 2 days ago.  The problem occurs more than 10 times per day. The problem has been unchanged. The emesis has an appearance of stomach contents. There has been no fever. Associated symptoms include diarrhea. Pertinent negatives include no abdominal pain, chills or fever. Risk factors include ill contacts (pregnancy. Patient also works in a daycare. ). Treatments tried: patient has been to the ER 2 x in the last 24 hours. She has gotten iv fluids and reglan. She did not get any of the RX she was given filled, and has not taken any medications at home.  The treatment provided no relief.   Past Medical History  Diagnosis Date  . Medical history non-contributory     Past Surgical History  Procedure Laterality Date  . Cesarean section      History reviewed. No pertinent family history.  Social History  Substance Use Topics  . Smoking status: Former Smoker    Types: Cigarettes  . Smokeless tobacco: Never Used  . Alcohol Use: No    Allergies: No Known Allergies  Prescriptions prior to admission  Medication Sig Dispense Refill Last Dose  . Doxylamine-Pyridoxine 10-10 MG TBEC Take two tablets tonight at bedtime and two tomorrow at bedtime.  If symptoms persist and are not controlled then you may increased to 1 tablet in the morning and two at bedtime.  If after 3 days symptoms are still not controlled then you may take 1 tablet in the morning, 1 at mid day, and 2 at bedtime. 60 tablet 2   . metoCLOPramide (REGLAN) 10 MG tablet Take 1 tablet (10 mg total) by mouth every 8 (eight) hours as needed for nausea or vomiting. 30 tablet 0     Review of Systems  Constitutional: Negative for fever and chills.  Gastrointestinal: Positive for nausea, vomiting and  diarrhea. Negative for abdominal pain and constipation.  Genitourinary: Negative for dysuria, urgency and frequency.   Physical Exam   Blood pressure 120/81, pulse 85, temperature 97.7 F (36.5 C), temperature source Oral, resp. rate 18, height 5\' 5"  (1.651 m), weight 74.39 kg (164 lb), last menstrual period 05/17/2016, SpO2 100 %, unknown if currently breastfeeding.  Physical Exam  Nursing note and vitals reviewed. Constitutional: She is oriented to person, place, and time. She appears well-developed and well-nourished. No distress.  HENT:  Head: Normocephalic.  Cardiovascular: Normal rate.   Respiratory: Effort normal.  GI: Soft. There is no tenderness. There is no rebound.  Neurological: She is alert and oriented to person, place, and time.  Skin: Skin is warm and dry.  Psychiatric: She has a normal mood and affect.    MAU Course  Procedures  MDM 0218: Patient has had D5LR with phenergan. She is tolerating PO, and has not vomited since being here.   Assessment and Plan   1. Nausea and vomiting during pregnancy prior to [redacted] weeks gestation    DC home Comfort measures reviewed  1st Trimester precautions  RX: phenergan PRN #[id number]  Return to MAU as needed FU with OB as planned  Follow-up Information    [facility] an [facility] as [facility]   Contact information:   [address]  [place] [address] [phone]        Tawnya Crook 06/03/2016, 1:26 AM

## 2016-06-04 ENCOUNTER — Inpatient Hospital Stay (EMERGENCY_DEPARTMENT_HOSPITAL)
Admission: AD | Admit: 2016-06-04 | Discharge: 2016-06-04 | Disposition: A | Discharge status: Home / Self Care | Payer: Medicaid Other | Source: Ambulatory Visit | Attending: Obstetrics & Gynecology | Admitting: Obstetrics & Gynecology

## 2016-06-04 ENCOUNTER — Encounter (HOSPITAL_COMMUNITY): Payer: Self-pay | Admitting: *Deleted

## 2016-06-04 DIAGNOSIS — Z87891 Personal history of nicotine dependence: Secondary | ICD-10-CM

## 2016-06-04 DIAGNOSIS — Z3A01 Less than 8 weeks gestation of pregnancy: Secondary | ICD-10-CM

## 2016-06-04 DIAGNOSIS — O26891 Other specified pregnancy related conditions, first trimester: Secondary | ICD-10-CM

## 2016-06-04 DIAGNOSIS — R12 Heartburn: Secondary | ICD-10-CM

## 2016-06-04 DIAGNOSIS — O210 Mild hyperemesis gravidarum: Secondary | ICD-10-CM | POA: Insufficient documentation

## 2016-06-04 DIAGNOSIS — O219 Vomiting of pregnancy, unspecified: Secondary | ICD-10-CM

## 2016-06-04 HISTORY — DX: Vomiting, unspecified: R11.10

## 2016-06-04 LAB — URINALYSIS, ROUTINE W REFLEX MICROSCOPIC
Bilirubin Urine: NEGATIVE
Glucose, UA: NEGATIVE mg/dL
KETONES UR: 40 mg/dL — AB
LEUKOCYTES UA: NEGATIVE
NITRITE: NEGATIVE
PROTEIN: NEGATIVE mg/dL
Specific Gravity, Urine: 1.02 (ref 1.005–1.030)
pH: 6.5 (ref 5.0–8.0)

## 2016-06-04 LAB — COMPREHENSIVE METABOLIC PANEL
ALT: 12 U/L — AB (ref 14–54)
ANION GAP: 9 (ref 5–15)
AST: 19 U/L (ref 15–41)
Albumin: 3.8 g/dL (ref 3.5–5.0)
Alkaline Phosphatase: 36 U/L — ABNORMAL LOW (ref 38–126)
BUN: 5 mg/dL — ABNORMAL LOW (ref 6–20)
CHLORIDE: 107 mmol/L (ref 101–111)
CO2: 19 mmol/L — ABNORMAL LOW (ref 22–32)
CREATININE: 0.57 mg/dL (ref 0.44–1.00)
Calcium: 8.8 mg/dL — ABNORMAL LOW (ref 8.9–10.3)
Glucose, Bld: 95 mg/dL (ref 65–99)
POTASSIUM: 3.2 mmol/L — AB (ref 3.5–5.1)
Sodium: 135 mmol/L (ref 135–145)
Total Bilirubin: 0.5 mg/dL (ref 0.3–1.2)
Total Protein: 6.8 g/dL (ref 6.5–8.1)

## 2016-06-04 LAB — URINE MICROSCOPIC-ADD ON

## 2016-06-04 LAB — CBC
HCT: 31.7 % — ABNORMAL LOW (ref 36.0–46.0)
Hemoglobin: 10.9 g/dL — ABNORMAL LOW (ref 12.0–15.0)
MCH: 26.7 pg (ref 26.0–34.0)
MCHC: 34.4 g/dL (ref 30.0–36.0)
MCV: 77.5 fL — AB (ref 78.0–100.0)
PLATELETS: 216 10*3/uL (ref 150–400)
RBC: 4.09 MIL/uL (ref 3.87–5.11)
RDW: 13.6 % (ref 11.5–15.5)
WBC: 13 10*3/uL — ABNORMAL HIGH (ref 4.0–10.5)

## 2016-06-04 MED ORDER — PROMETHAZINE HCL 25 MG/ML IJ SOLN
25.0000 mg | Freq: Once | INTRAMUSCULAR | Status: AC
  Administered 2016-06-04: 25 mg via INTRAVENOUS
  Filled 2016-06-04: qty 1

## 2016-06-04 MED ORDER — RANITIDINE HCL 150 MG PO TABS: 150.0000 mg | ORAL_TABLET | Freq: Two times a day (BID) | ORAL | Status: DC

## 2016-06-04 MED ORDER — LACTATED RINGERS IV BOLUS (SEPSIS)
1000.0000 mL | Freq: Once | INTRAVENOUS | Status: AC
  Administered 2016-06-04: 1000 mL via INTRAVENOUS

## 2016-06-04 MED ORDER — FAMOTIDINE IN NACL 20-0.9 MG/50ML-% IV SOLN
20.0000 mg | Freq: Once | INTRAVENOUS | Status: AC
  Administered 2016-06-04: 20 mg via INTRAVENOUS
  Filled 2016-06-04: qty 50

## 2016-06-04 MED ORDER — M.V.I. ADULT IV INJ
Freq: Once | INTRAVENOUS | Status: AC
  Administered 2016-06-04: 10:00:00 via INTRAVENOUS
  Filled 2016-06-04: qty 10

## 2016-06-04 NOTE — MAU Note (Signed)
Up to BR to void. No vomiting since arrival to MAU. Appears more comfortable. Resting, dozing.

## 2016-06-04 NOTE — MAU Note (Signed)
3rd visit to MAU in 3 days. N/V, abdominal pain. States pain is worse today. Phenergan doesn't help. Has Rx for Zofran that she hasn't filled. States she just felt too bad [facility]

## 2016-06-04 NOTE — MAU Provider Note (Signed)
History     CSN: [id number]  Arrival date and time: 06/04/16 0757   First Provider Initiated Contact with Patient 06/04/16 [phone]      Chief Complaint  Patient presents with  . Morning Sickness  . Abdominal Pain   HPI  Laura Duncan is a 25 y.o. [id number] at [redacted]w[redacted]d who presents with n/v & epigastric pain. This is her 4th ED/MAU visit for same symptoms in 2 days. Has been taking promethazine with limited relief. Was prescribed zofran last night but has not filled that prescription yet. Began vomiting again this morning. Reports vomiting several times since then. Denies diarrhea, abdominal pain, vaginal bleeding, or constipation. IUP confirmed on ultrasound on 06/02/16. Reports mid epigastric pain that feels like soreness & burning.   OB History    Gravida Para Term Preterm AB TAB SAB Ectopic Multiple Living   Past Medical History  Diagnosis Date  . Hyperemesis     Past Surgical History  Procedure Laterality Date  . Cesarean section      History reviewed. No pertinent family history.  Social History  Substance Use Topics  . Smoking status: Former Smoker    Types: Cigarettes  . Smokeless tobacco: Never Used  . Alcohol Use: No    Allergies: No Known Allergies  Prescriptions prior to admission  Medication Sig Dispense Refill Last Dose  . Doxylamine-Pyridoxine 10-10 MG TBEC Take two tablets tonight at bedtime and two tomorrow at bedtime.  If symptoms persist and are not controlled then you may increased to 1 tablet in the morning and two at bedtime.  If after 3 days symptoms are still not controlled then you may take 1 tablet in the morning, 1 at mid day, and 2 at bedtime. (Patient not taking: Reported on 06/03/2016) 60 tablet 2 Not Taking at Unknown time  . ondansetron (ZOFRAN ODT) 8 MG disintegrating tablet Take 1 tablet (8 mg total) by mouth every 8 (eight) hours as needed for nausea or vomiting. 20 tablet 0   . promethazine (PHENERGAN) 25 MG tablet Take  0.5-1 tablets (12.5-25 mg total) by mouth every 6 (six) hours as needed. 30 tablet 1 06/03/2016 at Unknown time    Review of Systems  Constitutional: Negative for fever and chills.  Cardiovascular: Negative.   Gastrointestinal: Positive for heartburn, nausea, vomiting and abdominal pain (epigastric). Negative for diarrhea and constipation.  Genitourinary: Negative.   Neurological: Positive for weakness.   Physical Exam   Blood pressure 127/68, pulse 68, temperature 98 F (36.7 C), temperature source Oral, resp. rate 16, height  (1.651 m), weight 166 lb (75.297 kg), last menstrual period 04/17/2016, unknown if currently breastfeeding.  Physical Exam  Nursing note and vitals reviewed. Constitutional: She is oriented to person, place, and time. She appears well-developed and well-nourished. She appears distressed (pt rocking on bed & crying).  HENT:  Head: Normocephalic and atraumatic.  Eyes: Conjunctivae are normal. Right eye exhibits no discharge. Left eye exhibits no discharge. No scleral icterus.  Neck: Normal range of motion.  Cardiovascular: Normal rate, regular rhythm and normal heart sounds.   No murmur heard. Respiratory: Effort normal and breath sounds normal. No respiratory distress. She has no wheezes.  GI: Soft. Bowel sounds are normal. She exhibits no distension. There is no tenderness. There is no rebound and no guarding.  Neurological: She is alert and oriented to person, place, and time.  Skin: Skin is warm and  dry. She is not diaphoretic.  Psychiatric: She has a normal mood and affect. Her behavior is normal. Judgment and thought content normal.    MAU Course  Procedures Results for orders placed or performed during the hospital encounter of 06/04/16 (from the past 24 hour(s))  Urinalysis, Routine w reflex microscopic (not at [facility])     Status: Abnormal   Collection Time: 06/04/16  8:04 AM  Result Value Ref Range   Color, Urine YELLOW YELLOW   APPearance CLEAR  CLEAR   Specific Gravity, Urine 1.020 1.005 - 1.030   pH 6.5 5.0 - 8.0   Glucose, UA NEGATIVE NEGATIVE mg/dL   Hgb urine dipstick TRACE (A) NEGATIVE   Bilirubin Urine NEGATIVE NEGATIVE   Ketones, ur 40 (A) NEGATIVE mg/dL   Protein, ur NEGATIVE NEGATIVE mg/dL   Nitrite NEGATIVE NEGATIVE   Leukocytes, UA NEGATIVE NEGATIVE  Urine microscopic-add on     Status: Abnormal   Collection Time: 06/04/16  8:04 AM  Result Value Ref Range   Squamous Epithelial / LPF 0-5 (A) NONE SEEN   WBC, UA 0-5 0 - 5 WBC/hpf   RBC / HPF 0-5 0 - 5 RBC/hpf   Bacteria, UA MANY (A) NONE SEEN   Urine-Other MUCOUS PRESENT   CBC     Status: Abnormal   Collection Time: 06/04/16  8:49 AM  Result Value Ref Range   WBC 13.0 (H) 4.0 - 10.5 K/uL   RBC 4.09 3.87 - 5.11 MIL/uL   Hemoglobin 10.9 (L) 12.0 - 15.0 g/dL   HCT 78.231.7 (L) 95.636.0 - 21.346.0 %   MCV 77.5 (L) 78.0 - 100.0 fL   MCH 26.7 26.0 - 34.0 pg   MCHC 34.4 30.0 - 36.0 g/dL   RDW 08.613.6 57.811.5 - 46.915.5 %   Platelets 216 150 - 400 K/uL  Comprehensive metabolic panel     Status: Abnormal   Collection Time: 06/04/16  8:49 AM  Result Value Ref Range   Sodium 135 135 - 145 mmol/L   Potassium 3.2 (L) 3.5 - 5.1 mmol/L   Chloride 107 101 - 111 mmol/L   CO2 19 (L) 22 - 32 mmol/L   Glucose, Bld 95 65 - 99 mg/dL   BUN 5 (L) 6 - 20 mg/dL   Creatinine, Ser 6.290.57 0.44 - 1.00 mg/dL   Calcium 8.8 (L) 8.9 - 10.3 mg/dL   Total Protein 6.8 6.5 - 8.1 g/dL   Albumin 3.8 3.5 - 5.0 g/dL   AST 19 15 - 41 U/L   ALT 12 (L) 14 - 54 U/L   Alkaline Phosphatase 36 (L) 38 - 126 U/L   Total Bilirubin 0.5 0.3 - 1.2 mg/dL   GFR calc non Af Amer >60 >60 mL/min   GFR calc Af Amer >60 >60 mL/min   Anion gap 9 5 - 15    MDM Weight stable VSS IV hydration - LR x 1 liter, MVI in D5LR x 1 liter Pepcid 20 mg IV Promethazine 25 mg IV CBC, CMP Pt reports improvement in symptoms & has not been observed vomiting in MAU  Assessment and Plan  A: 1. Nausea and vomiting during pregnancy prior to  [redacted] weeks gestation   2. Heartburn during pregnancy in first trimester     P: Discharge home Fill zofran prescription Rx zantac HEG diet information given Discussed reasons to return to MAU Start prenatal care  Judeth Hornrin Markea Ruzich 06/04/2016, 8:19 AM

## 2016-06-04 NOTE — Discharge Instructions (Signed)
Eating Plan for Hyperemesis Gravidarum Severe cases of hyperemesis gravidarum can lead to dehydration and malnutrition. The hyperemesis eating plan is one way to lessen the symptoms of nausea and vomiting. It is often used with prescribed medicines to control your symptoms.  WHAT CAN I DO TO RELIEVE MY SYMPTOMS? Listen to your body. Everyone is different and has different preferences. Find what works best for you. Some of the following things may help:  Eat and drink slowly.  Eat 5-6 small meals daily instead of 3 large meals.   Eat crackers before you get out of bed in the morning.   Starchy foods are usually well tolerated (such as cereal, toast, bread, potatoes, pasta, rice, and pretzels).   Ginger may help with nausea. Add  tsp ground ginger to hot tea or choose ginger tea.   Try drinking 100% fruit juice or an electrolyte drink.  Continue to take your prenatal vitamins as directed by your health care provider. If you are having trouble taking your prenatal vitamins, talk with your health care provider about different options.  Include at least 1 serving of protein with your meals and snacks (such as meats or poultry, beans, nuts, eggs, or yogurt). Try eating a protein-rich snack before bed (such as cheese and crackers or a half [place] or peanut butter sandwich). WHAT THINGS SHOULD I AVOID TO REDUCE MY SYMPTOMS? The following things may help reduce your symptoms:  Avoid foods with strong smells. Try eating meals in well-ventilated areas that are free of odors.  Avoid drinking water or other beverages with meals. Try not to drink anything less than 30 minutes before and after meals.  Avoid drinking more than 1 cup of fluid at a time.  Avoid fried or high-fat foods, such as butter and cream sauces.  Avoid spicy foods.  Avoid skipping meals the best you can. Nausea can be more intense on an empty stomach. If you cannot tolerate food at that time, do not force it. Try sucking on  ice chips or other frozen items and make up the calories later.  Avoid lying down within 2 hours after eating.   This information is not intended to replace advice given to you by your health care provider. Make sure you discuss any questions you have with your health care provider.   Document Released: 10/03/2007 Document Revised: 12/11/2013 Document Reviewed: 10/10/2013 Elsevier Interactive Patient Education 2016 Elsevier Inc.    [facility] [facility]   Francoise CeoBernard Marshall      Phone: [phone]  [facility]     Phone: [phone]  [facility]  Phone: [phone]  [facility] [place]  Phone: [phone]  [facility]    Phone: [phone]   [facility]    Phone: [phone]  [facility]    Phone: [phone]  [facility]    Phone: [phone]  [facility]    Phone: [phone]  [facility] Phone: [phone]   [facility]  Phone: [phone]  [facility]    Phone: [phone]  [facility]   Phone: [phone]  [facility]      Phone: [phone]  [facility]    Phone: [phone]  [facility]     Phone: [phone]

## 2016-06-05 ENCOUNTER — Inpatient Hospital Stay (HOSPITAL_COMMUNITY)
Admission: AD | Admit: 2016-06-05 | Discharge: 2016-06-05 | Disposition: A | Discharge status: Home / Self Care | Payer: Medicaid Other | Source: Ambulatory Visit | Attending: Obstetrics and Gynecology | Admitting: Obstetrics and Gynecology

## 2016-06-05 ENCOUNTER — Encounter (HOSPITAL_COMMUNITY): Payer: Self-pay | Admitting: *Deleted

## 2016-06-05 DIAGNOSIS — Z87891 Personal history of nicotine dependence: Secondary | ICD-10-CM | POA: Diagnosis not present

## 2016-06-05 DIAGNOSIS — Z3A01 Less than 8 weeks gestation of pregnancy: Secondary | ICD-10-CM | POA: Insufficient documentation

## 2016-06-05 DIAGNOSIS — O210 Mild hyperemesis gravidarum: Secondary | ICD-10-CM | POA: Insufficient documentation

## 2016-06-05 LAB — URINALYSIS, ROUTINE W REFLEX MICROSCOPIC
Glucose, UA: NEGATIVE mg/dL
Ketones, ur: >80 mg/dL — AB
Nitrite: NEGATIVE
Protein, ur: 100 mg/dL — AB
Specific Gravity, Urine: 1.015 (ref 1.005–1.030)
pH: 8.5 — ABNORMAL HIGH (ref 5.0–8.0)

## 2016-06-05 LAB — URINE MICROSCOPIC-ADD ON

## 2016-06-05 MED ORDER — ACETAMINOPHEN 650 MG RE SUPP
650.0000 mg | Freq: Once | RECTAL | Status: AC
  Administered 2016-06-05: 650 mg via RECTAL
  Filled 2016-06-05: qty 1

## 2016-06-05 MED ORDER — PROMETHAZINE HCL 25 MG RE SUPP
25.0000 mg | Freq: Four times a day (QID) | RECTAL | Status: DC | PRN
  Administered 2016-06-05: 25 mg via RECTAL
  Filled 2016-06-05: qty 1

## 2016-06-05 MED ORDER — PROMETHAZINE HCL 25 MG RE SUPP: 25.0000 mg | Freq: Four times a day (QID) | RECTAL | Status: DC | PRN

## 2016-06-05 NOTE — Discharge Instructions (Signed)
Continue liquids today and soft foods as tolerated.  DO NOT EAT french fries or other fried food - burger, hot dogs or pizza. Continue with your plans for this pregnancy. Use your medications as needed to control the vomiting.  Put Zofran under your tongue to dissolve. You can take more Phenergan at 4 pm today.

## 2016-06-05 NOTE — MAU Provider Note (Signed)
History     CSN: [id number]  Arrival date and time: 06/05/16 0747   First Provider Initiated Contact with Patient 06/05/16 [phone]      Chief Complaint  Patient presents with  . Morning Sickness   [facility] 25 [facility] - this is visit #5 in 3 days.  Was here on yesterday.   Has not been able to eat foods and thinks she needs IV fluids.  Is not able to void.  Crying.  Has medications at home.  Used Zofran in her mouth but not under her tongue.  Used Phenergan tablet rectally at 6 am but is still having abdominal pain and nausea.  Wants to feel better.  Has a TAB scheduled on Monday - is planning termination.    OB History    Gravida Para Term Preterm AB TAB SAB Ectopic Multiple Living   4 2  2 1 1    2       Past Medical History  Diagnosis Date  . Hyperemesis     Past Surgical History  Procedure Laterality Date  . Cesarean section      History reviewed. No pertinent family history.  Social History  Substance Use Topics  . Smoking status: Former Smoker    Types: Cigarettes  . Smokeless tobacco: Never Used  . Alcohol Use: No    Allergies: No Known Allergies  Prescriptions prior to admission  Medication Sig Dispense Refill Last Dose  . Doxylamine-Pyridoxine 10-10 MG TBEC Take two tablets tonight at bedtime and two tomorrow at bedtime.  If symptoms persist and are not controlled then you may increased to 1 tablet in the morning and two at bedtime.  If after 3 days symptoms are still not controlled then you may take 1 tablet in the morning, 1 at mid day, and 2 at bedtime. (Patient not taking: Reported on 06/03/2016) 60 tablet 2 Not Taking at Unknown time  . ondansetron (ZOFRAN ODT) 8 MG disintegrating tablet Take 1 tablet (8 mg total) by mouth every 8 (eight) hours as needed for nausea or vomiting. 20 tablet 0   . promethazine (PHENERGAN) 25 MG tablet Take 0.5-1 tablets (12.5-25 mg total) by mouth every 6 (six) hours as needed. 30 tablet 1 06/03/2016 at  Unknown time  . ranitidine (ZANTAC) 150 MG tablet Take 1 tablet (150 mg total) by mouth 2 (two) times daily. 60 tablet 0     Review of Systems  Constitutional: Negative for fever.  Gastrointestinal: Positive for nausea, vomiting, abdominal pain and constipation. Negative for diarrhea.  Genitourinary:       No vaginal discharge. No vaginal bleeding. No dysuria.  Neurological: Positive for headaches.   Physical Exam   Blood pressure 135/74, pulse 72, temperature 98.5 F (36.9 C), temperature source Oral, resp. rate 20, height 5\' 5"  (1.651 m), weight 162 lb 8 oz (73.71 kg), last menstrual period 04/17/2016, unknown if currently breastfeeding.  Physical Exam  Nursing note and vitals reviewed. Constitutional: She is oriented to person, place, and time. She appears well-developed and well-nourished.  HENT:  Head: Normocephalic.  Eyes: EOM are normal.  Neck: Neck supple.  GI: Soft. There is tenderness. There is no rebound and no guarding.  Musculoskeletal: Normal range of motion.  Neurological: She is alert and oriented to person, place, and time.  Skin: Skin is warm and dry.  Psychiatric: She has a normal mood and affect.    MAU Course  Procedures Results for orders placed or performed during  the hospital encounter of 06/05/16 (from the past 24 hour(s))  Urinalysis, Routine w reflex microscopic (not at [facility])     Status: Abnormal   Collection Time: 06/05/16  9:33 AM  Result Value Ref Range   Color, Urine YELLOW YELLOW   APPearance HAZY (A) CLEAR   Specific Gravity, Urine 1.015 1.005 - 1.030   pH 8.5 (H) 5.0 - 8.0   Glucose, UA NEGATIVE NEGATIVE mg/dL   Hgb urine dipstick LARGE (A) NEGATIVE   Bilirubin Urine SMALL (A) NEGATIVE   Ketones, ur >80 (A) NEGATIVE mg/dL   Protein, ur 742 (A) NEGATIVE mg/dL   Nitrite NEGATIVE NEGATIVE   Leukocytes, UA SMALL (A) NEGATIVE  Urine microscopic-add on     Status: Abnormal   Collection Time: 06/05/16  9:33 AM  Result Value Ref Range    Squamous Epithelial / LPF 0-5 (A) NONE SEEN   WBC, UA 0-5 0 - 5 WBC/hpf   RBC / HPF 0-5 0 - 5 RBC/hpf   Bacteria, UA MANY (A) NONE SEEN    MDM Given Phenergan 25 mg per rectal suppository and also Tylenol 650 mg per rectal suppository.  After resting in bed, able to take sips of fluids.  No vomiting seen in MAU today.  Assessment and Plan  Hyperemesis  Plan Use your medicines as prescribed. Eprescribed Phenergan suppositories in case the phenergan tablets are not well absorbed - client has not had BM in several days.  Can use phenergan tablet or suppository at 4 pm today. Have termination on Monday as planned or begin prenatal care ASAP. Have fluids today and advance to soft foods as tolerated.  No fried foods or fast foods today.  No pizza.  Serai Tukes 06/05/2016, 8:48 AM

## 2016-06-05 NOTE — MAU Note (Signed)
Seen here the past couple of days cant keep anything down

## 2016-08-25 ENCOUNTER — Emergency Department (HOSPITAL_BASED_OUTPATIENT_CLINIC_OR_DEPARTMENT_OTHER)
Admission: EM | Admit: 2016-08-25 | Discharge: 2016-08-25 | Disposition: A | Discharge status: Home / Self Care | Payer: Medicaid Other | Attending: Emergency Medicine | Admitting: Emergency Medicine

## 2016-08-25 ENCOUNTER — Encounter (HOSPITAL_BASED_OUTPATIENT_CLINIC_OR_DEPARTMENT_OTHER): Payer: Self-pay

## 2016-08-25 DIAGNOSIS — R109 Unspecified abdominal pain: Secondary | ICD-10-CM | POA: Diagnosis not present

## 2016-08-25 DIAGNOSIS — R112 Nausea with vomiting, unspecified: Secondary | ICD-10-CM | POA: Insufficient documentation

## 2016-08-25 DIAGNOSIS — Z87891 Personal history of nicotine dependence: Secondary | ICD-10-CM | POA: Insufficient documentation

## 2016-08-25 LAB — HCG, SERUM, QUALITATIVE: Preg, Serum: NEGATIVE

## 2016-08-25 MED ORDER — PROMETHAZINE HCL 25 MG/ML IJ SOLN
12.5000 mg | Freq: Once | INTRAMUSCULAR | Status: AC
  Administered 2016-08-25: 12.5 mg via INTRAMUSCULAR
  Filled 2016-08-25: qty 1

## 2016-08-25 MED ORDER — AZITHROMYCIN 250 MG PO TABS: 1000.0000 mg | ORAL_TABLET | Freq: Once | ORAL | 0 refills | Status: AC

## 2016-08-25 MED ORDER — ONDANSETRON HCL 8 MG PO TABS: 8.0000 mg | ORAL_TABLET | Freq: Three times a day (TID) | ORAL | 0 refills | Status: DC | PRN

## 2016-08-25 MED ORDER — AZITHROMYCIN 250 MG PO TABS: 1000.0000 mg | ORAL_TABLET | Freq: Once | ORAL | Status: DC

## 2016-08-25 NOTE — ED Provider Notes (Signed)
[facility] Provider Note   CSN: [id number] Arrival date & time: 08/25/16  1528     History   Chief Complaint Chief Complaint  Patient presents with  . Abdominal Pain    HPI   Laura Duncan is a 25 y.o. female with pmhx of hyperemesis presenting with nausea and vomiting.  [place] name[place] for STD infection as she had been having vaginal discharge and abdominal pain. Furthermore her boyfriend had been treated for STDs.  She received ceftriaxone shot and 7 days of doxocycline. Patient started taking doxocyline yesterday, and states since then she has been nauseated. Has vomited 3 times since starting doxycycline. She then vomited x 1 while in the ED. Patient has not been able to keep the doxocyline. Denies any diarrhea. Patient with intermittent abdominal pain for about 1 month for which urgent care was treating her with Ceftriaxone and doxocyline. Patient states she just had her menustrual cycle last week. Patient denies a being sexual active since then. No dysuria, urgency, or frequency.       Past Medical History:  Diagnosis Date  . Hyperemesis     Patient Active Problem List   Diagnosis Date Noted  . Hyperemesis 06/02/2016    Past Surgical History:  Procedure Laterality Date  . CESAREAN SECTION    . INDUCED ABORTION      OB History    Gravida Para Term Preterm AB Living   4 2   2 1 2    SAB TAB Ectopic Multiple Live Births     1             Home Medications    Prior to Admission medications   Medication Sig Start Date End Date Taking? Authorizing Provider  azithromycin (ZITHROMAX) 250 MG tablet Take 4 tablets (1,000 mg total) by mouth once. 08/25/16 08/25/16  Sabra Sessler Mayra ReelZahra Jessaca Philippi, MD  ondansetron (ZOFRAN) 8 MG tablet Take 1 tablet (8 mg total) by mouth every 8 (eight) hours as needed for nausea or vomiting. 08/25/16   Chidera Dearcos Mayra ReelZahra Reine Bristow, MD    Family History No family history on file.  Social  History Social History  Substance Use Topics  . Smoking status: Former Smoker    Types: Cigarettes  . Smokeless tobacco: Never Used  . Alcohol use No     Allergies   Review of patient's allergies indicates no known allergies.   Review of Systems Review of Systems  Constitutional: Negative for chills and fever.  Gastrointestinal: Positive for abdominal pain, nausea and vomiting. Negative for diarrhea.  Genitourinary: Negative for difficulty urinating, dysuria, flank pain, urgency and vaginal pain.     Physical Exam Updated Vital Signs BP 132/79 (BP Location: Right Arm)   Pulse 72   Temp 98.3 F (36.8 C) (Oral)   Resp 20   Ht 5\' 5"  (1.651 m)   Wt 72.6 kg   LMP 08/16/2016   SpO2 99%   Breastfeeding? Unknown Comment: induced abortion 07/07/16  BMI 26.63 kg/m   Physical Exam  Constitutional: She is oriented to person, place, and time. She appears well-developed and well-nourished.  HENT:  Head: Normocephalic and atraumatic.  Eyes: Conjunctivae are normal.  Neck: Normal range of motion. Neck supple.  Cardiovascular: Normal rate, regular rhythm, normal heart sounds and intact distal pulses.   Pulmonary/Chest: Effort normal and breath sounds normal.  Abdominal: Soft. Bowel sounds are normal. There is no tenderness. There is no guarding.  Musculoskeletal: Normal range  of motion.  Neurological: She is alert and oriented to person, place, and time.  Skin: Skin is warm and dry.     ED Treatments / Results  Labs (all labs ordered are listed, but only abnormal results are displayed) Labs Reviewed  HCG, SERUM, QUALITATIVE    EKG  EKG Interpretation None       Radiology No results found.  Procedures Procedures (including critical care time)  Medications Ordered in ED Medications  promethazine (PHENERGAN) injection 12.5 mg (12.5 mg Intramuscular Given 08/25/16 1637)  promethazine (PHENERGAN) injection 12.5 mg (12.5 mg Intramuscular Given 08/25/16 1757)      Initial Impression / Assessment and Plan / ED Course  I have reviewed the triage vital signs and the nursing notes.  Pertinent labs & imaging results that were available during my care of the patient were reviewed by me and considered in my medical decision making (see chart for details).  Clinical Course    Patient with nausea and vomiting likely due to doxycycline. Provided patient with phenergan x 2 IM for nausea. Indicated patient to stop doxycycline and provide prescription for azithromycin. Also provided patient with zofran for nausea relief. Patient well hydrated, patient able to keep up down fluids.   Final Clinical Impressions(s) / ED Diagnoses   Final diagnoses:  Non-intractable vomiting with nausea, vomiting of unspecified type    New Prescriptions Discharge Medication List as of 08/25/2016  6:17 PM    START taking these medications   Details  azithromycin (ZITHROMAX) 250 MG tablet Take 4 tablets (1,000 mg total) by mouth once., Starting Wed 08/25/2016, Print         Jaiel Saraceno Mayra Reel, MD 08/25/16 2018    Rolland Porter, MD 09/04/16 1511

## 2016-08-25 NOTE — ED Notes (Signed)
Pt states she is unable to provide a urine sample.

## 2016-08-25 NOTE — ED Notes (Signed)
Patient still reporting vomiting, no emesis noted. Patient heard dry heaving.

## 2016-08-25 NOTE — ED Notes (Signed)
Patient still dry heaving from room. When nurse enters, patient is resting on stretcher, no emesis noted at this time.

## 2016-08-25 NOTE — ED Provider Notes (Signed)
Agree with Dr. Aris GeorgiaMikell's note.  Pt examined.  No emesis in ER.  Requesting IV, but pt not tachy, or vomiting here. Given phenergan.  Benign abdomen. Recommend HCG testing, and if negative Dc with antiemetic and change Doxy to Azith.   Rolland PorterMark Jt Brabec, MD 08/25/16 [phone]

## 2016-08-25 NOTE — ED Triage Notes (Signed)
Pt in w/c in reg-brought into triage-pt slumped over in w/c-did not answer ?s upon arrival-was asked to sit up for VS-pt sat straight up and began to scream and curse at staff/hyperventilating with hands drawn-pt was advised she would need to slow her breathing, calm herself and cooperate with staff-pt eventually calmed, answered questions with head bowed and eyes closed-pt c/o abd pain-states she was seen at urgent care on Monday started on abx-pt states no one is here with her she was "dropped off"

## 2016-08-25 NOTE — Discharge Instructions (Signed)
I want you stop the doxycycline as this could be exacerbating your nausea and vomiting. I have provided with a single dose of azithromycin (4 tablets) which should treat you for possible bacterial infection. You can use Zofran as needed for nausea.

## 2017-12-29 IMAGING — US US OB COMP LESS 14 WK
1 series · 14 of 28 positions shown · non-contrast
Comparison: None.

CLINICAL DATA: Vaginal bleeding with positive pregnancy test.

EXAM:
OBSTETRIC <14 WK US AND TRANSVAGINAL OB US
TECHNIQUE: Both transabdominal and transvaginal ultrasound examinations were
performed for complete evaluation of the gestation as well as the
maternal uterus, adnexal regions, and pelvic cul-de-sac.
Transvaginal technique was performed to assess early pregnancy.

[Series 1: us ob comp less 14 wk · 0.10mm/px · 14 of 36 slices shown]
[im 2/36]
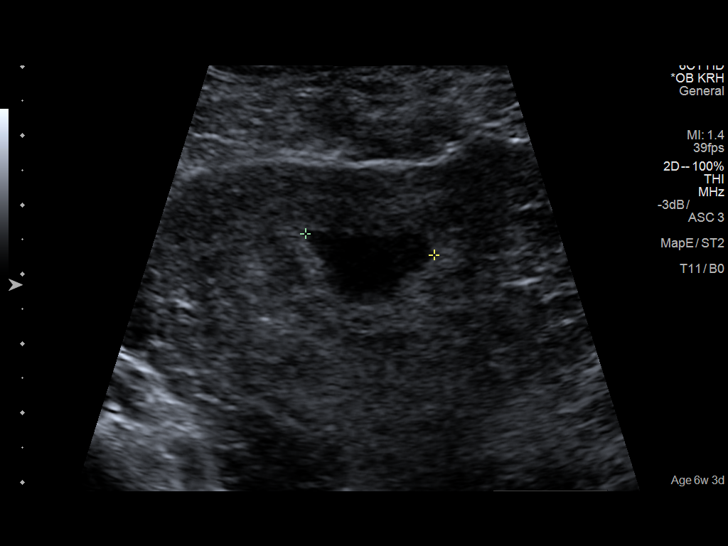
[im 4/36]
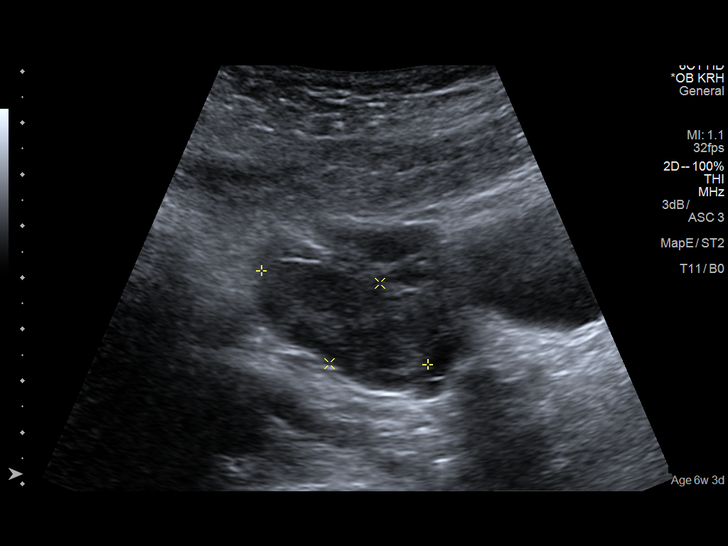
[im 7/36]
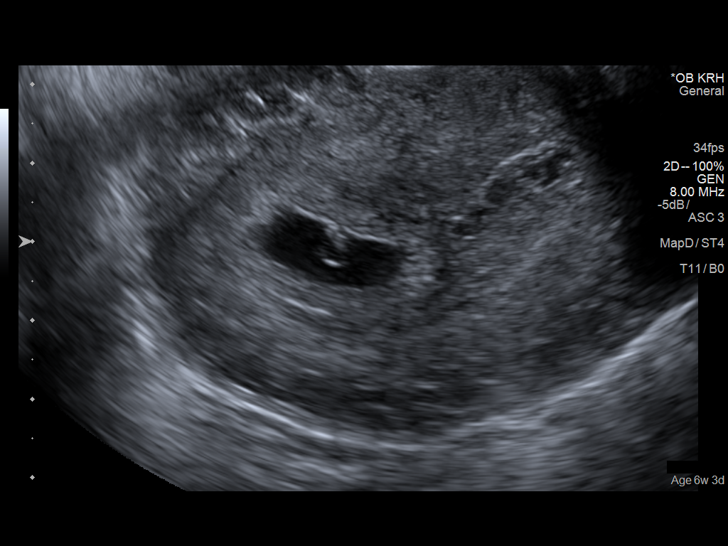
[im 10/36]
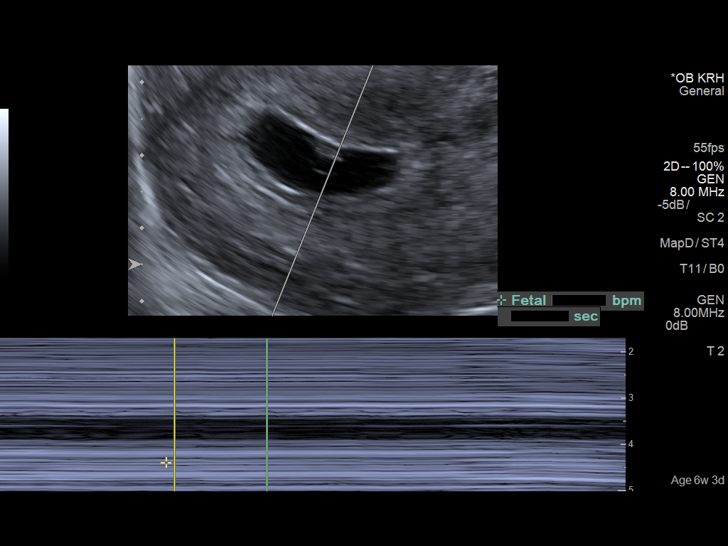
[im 12/36]
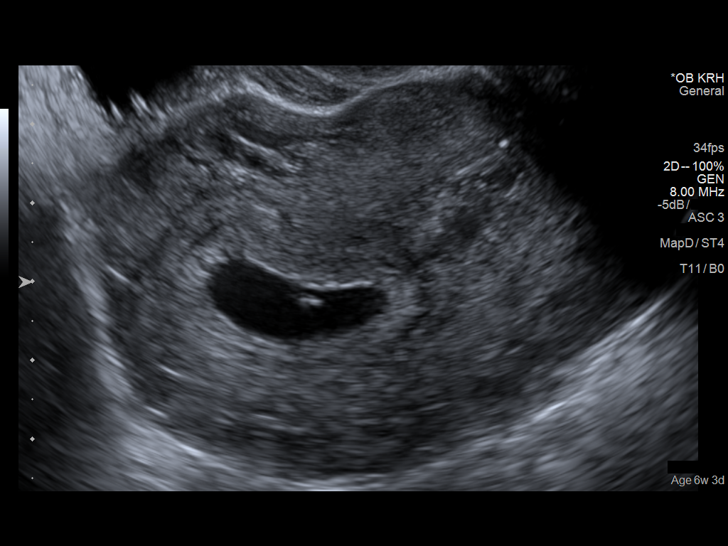
[im 15/36]
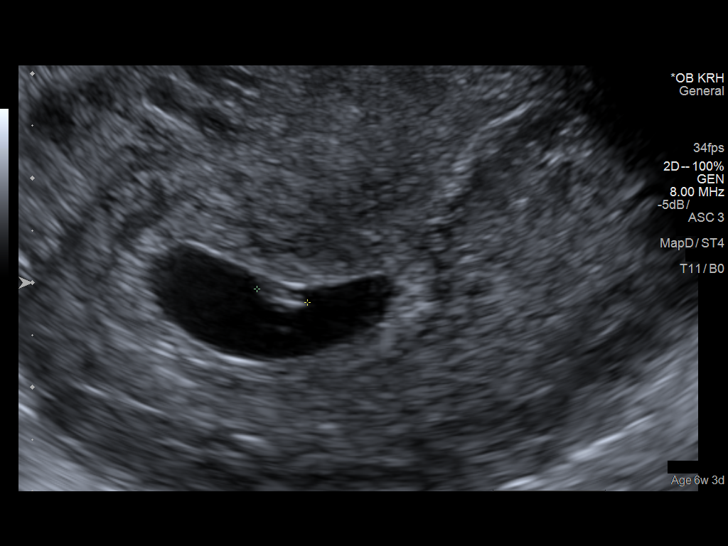
[im 17/36]
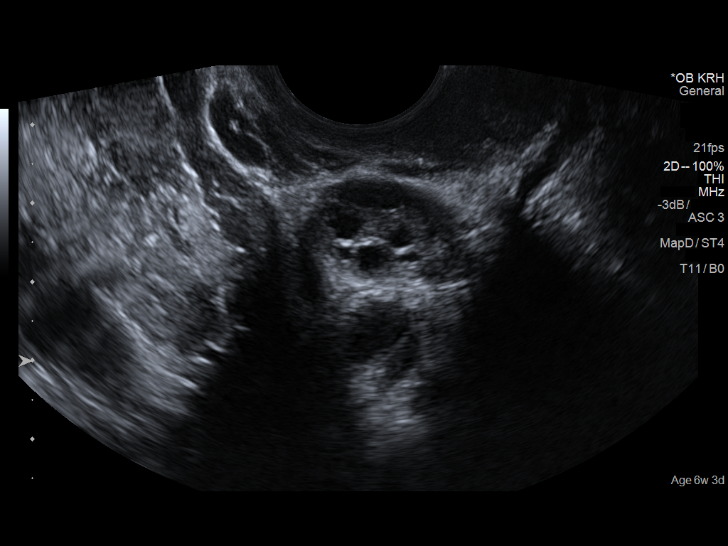
[im 20/36]
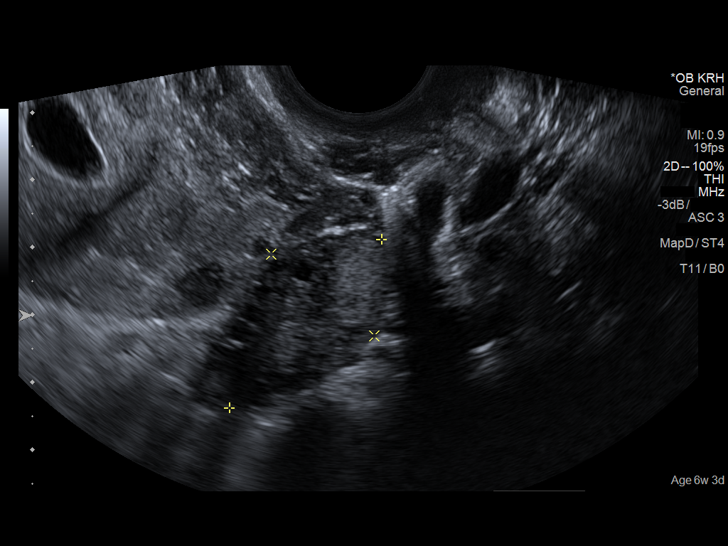
[im 23/36]
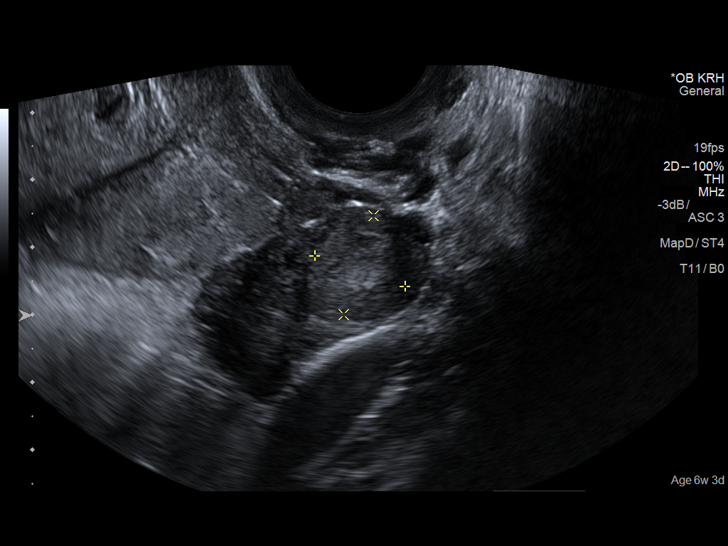
[im 25/36]
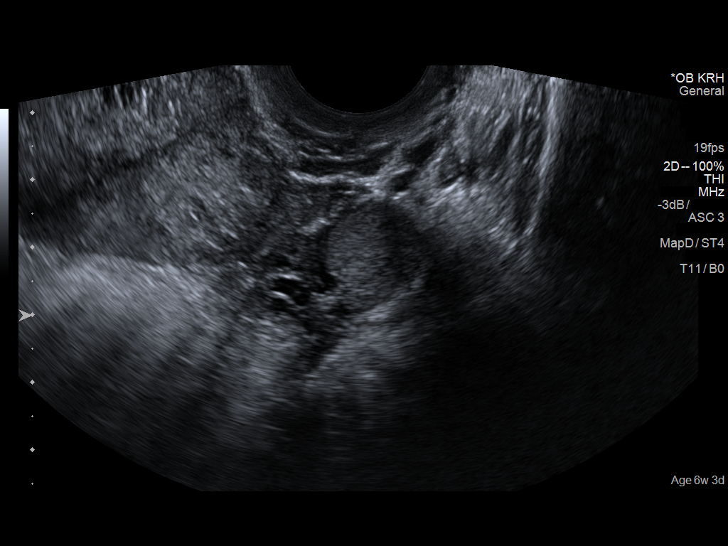
[im 28/36]
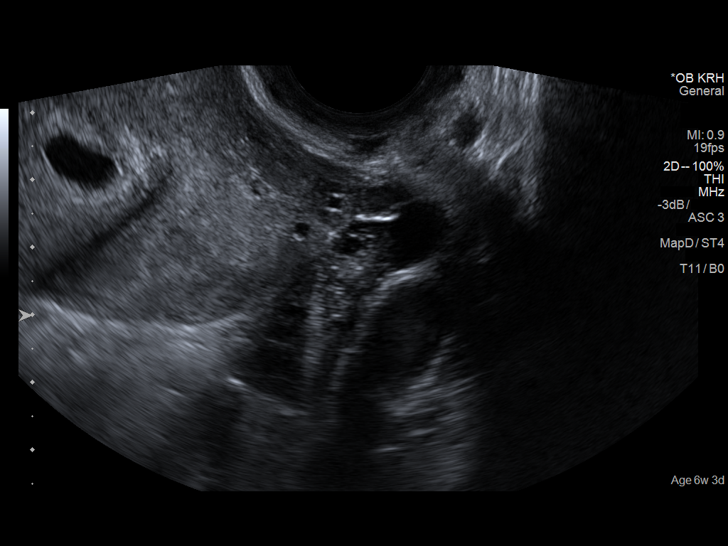
[im 30/36]
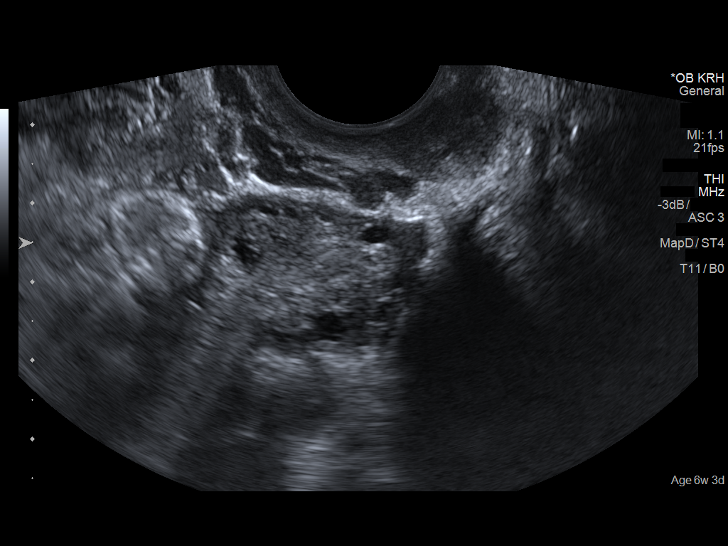
[im 33/36]
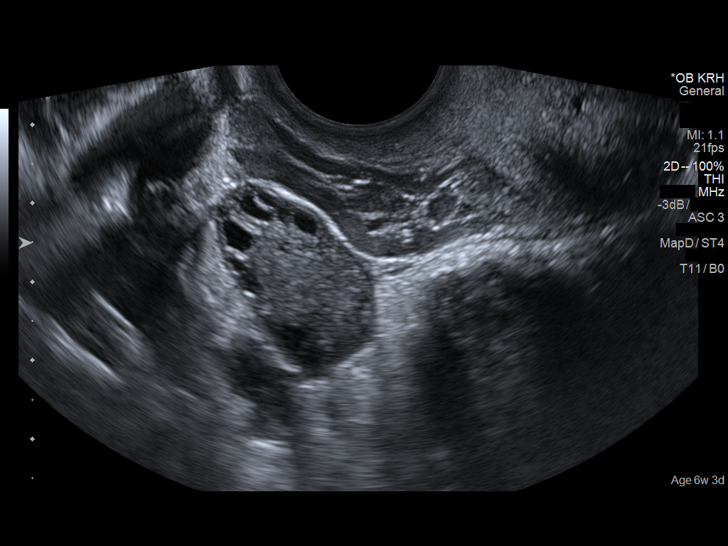
[im 36/36]
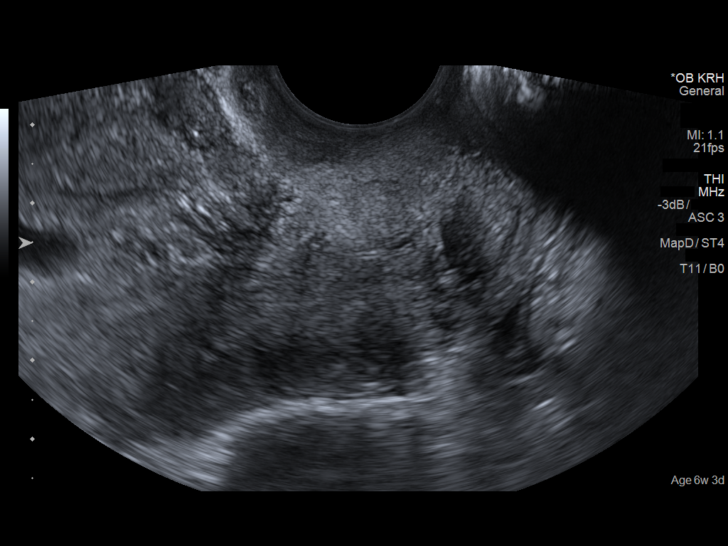

[14 of 28 positions shown; findings below may reference images not displayed]

FINDINGS: Intrauterine gestational sac: Visualized/normal in shape.

Yolk sac:  Visualized.

Embryo:  Visualized.

Cardiac Activity: Visualized.

Heart Rate: 111  bpm

CRL:  4.8  mm   6 w   1 d                  US EDC: 08/14/2016

Subchorionic hemorrhage:  None visualized.

Maternal uterus/adnexae: Corpus luteum cyst noted left ovary. No
adnexal mass. Tiny amount of free fluid seen adjacent to the left
ovary.
IMPRESSION: Single living intrauterine gestation at estimated 6 week 1 day
gestational age by crown-rump length.

## 2019-09-09 ENCOUNTER — Other Ambulatory Visit: Payer: Self-pay

## 2019-09-09 ENCOUNTER — Encounter (HOSPITAL_BASED_OUTPATIENT_CLINIC_OR_DEPARTMENT_OTHER): Payer: Self-pay | Admitting: Emergency Medicine

## 2019-09-09 ENCOUNTER — Emergency Department (HOSPITAL_BASED_OUTPATIENT_CLINIC_OR_DEPARTMENT_OTHER)
Admission: EM | Admit: 2019-09-09 | Discharge: 2019-09-09 | Disposition: A | Discharge status: Home / Self Care | Payer: Medicaid Other | Attending: Emergency Medicine | Admitting: Emergency Medicine

## 2019-09-09 ENCOUNTER — Other Ambulatory Visit: Payer: Self-pay | Admitting: Emergency Medicine

## 2019-09-09 DIAGNOSIS — R1111 Vomiting without nausea: Secondary | ICD-10-CM

## 2019-09-09 DIAGNOSIS — Z87891 Personal history of nicotine dependence: Secondary | ICD-10-CM | POA: Diagnosis not present

## 2019-09-09 DIAGNOSIS — R1110 Vomiting, unspecified: Secondary | ICD-10-CM | POA: Insufficient documentation

## 2019-09-09 DIAGNOSIS — R1084 Generalized abdominal pain: Secondary | ICD-10-CM | POA: Insufficient documentation

## 2019-09-09 LAB — CBC WITH DIFFERENTIAL/PLATELET
Abs Immature Granulocytes: 0.04 10*3/uL (ref 0.00–0.07)
Basophils Absolute: 0 10*3/uL (ref 0.0–0.1)
Basophils Relative: 0 %
Eosinophils Absolute: 0 10*3/uL (ref 0.0–0.5)
Eosinophils Relative: 0 %
HCT: 40.5 % (ref 36.0–46.0)
Hemoglobin: 13.4 g/dL (ref 12.0–15.0)
Immature Granulocytes: 0 %
Lymphocytes Relative: 21 %
Lymphs Abs: 2.1 10*3/uL (ref 0.7–4.0)
MCH: 27.1 pg (ref 26.0–34.0)
MCHC: 33.1 g/dL (ref 30.0–36.0)
MCV: 81.8 fL (ref 80.0–100.0)
Monocytes Absolute: 0.5 10*3/uL (ref 0.1–1.0)
Monocytes Relative: 5 %
Neutro Abs: 7.3 10*3/uL (ref 1.7–7.7)
Neutrophils Relative %: 74 %
Platelets: 253 10*3/uL (ref 150–400)
RBC: 4.95 MIL/uL (ref 3.87–5.11)
RDW: 12.9 % (ref 11.5–15.5)
WBC: 10 10*3/uL (ref 4.0–10.5)
nRBC: 0 % (ref 0.0–0.2)

## 2019-09-09 LAB — WET PREP, GENITAL
Clue Cells Wet Prep HPF POC: NONE SEEN
Sperm: NONE SEEN
Trich, Wet Prep: NONE SEEN
Yeast Wet Prep HPF POC: NONE SEEN

## 2019-09-09 LAB — RAPID URINE DRUG SCREEN, HOSP PERFORMED
Amphetamines: NOT DETECTED
Barbiturates: NOT DETECTED
Benzodiazepines: NOT DETECTED
Cocaine: NOT DETECTED
Opiates: NOT DETECTED
Tetrahydrocannabinol: POSITIVE — AB

## 2019-09-09 LAB — COMPREHENSIVE METABOLIC PANEL
ALT: 16 U/L (ref 0–44)
AST: 23 U/L (ref 15–41)
Albumin: 4.6 g/dL (ref 3.5–5.0)
Alkaline Phosphatase: 43 U/L (ref 38–126)
Anion gap: 19 — ABNORMAL HIGH (ref 5–15)
BUN: 12 mg/dL (ref 6–20)
CO2: 18 mmol/L — ABNORMAL LOW (ref 22–32)
Calcium: 9.5 mg/dL (ref 8.9–10.3)
Chloride: 104 mmol/L (ref 98–111)
Creatinine, Ser: 0.94 mg/dL (ref 0.44–1.00)
GFR calc Af Amer: >60 mL/min (ref >60)
GFR calc non Af Amer: >60 mL/min (ref >60)
Glucose, Bld: 108 mg/dL — ABNORMAL HIGH (ref 70–99)
Potassium: 3.2 mmol/L — ABNORMAL LOW (ref 3.5–5.1)
Sodium: 141 mmol/L (ref 135–145)
Total Bilirubin: 0.9 mg/dL (ref 0.3–1.2)
Total Protein: 8.1 g/dL (ref 6.5–8.1)

## 2019-09-09 LAB — URINALYSIS, ROUTINE W REFLEX MICROSCOPIC
Glucose, UA: NEGATIVE mg/dL
Hgb urine dipstick: NEGATIVE
Ketones, ur: >80 mg/dL — AB
Leukocytes,Ua: NEGATIVE
Nitrite: NEGATIVE
Protein, ur: 30 mg/dL — AB
Specific Gravity, Urine: 1.02 (ref 1.005–1.030)
pH: 8.5 — ABNORMAL HIGH (ref 5.0–8.0)

## 2019-09-09 LAB — URINALYSIS, MICROSCOPIC (REFLEX): RBC / HPF: NONE SEEN RBC/hpf (ref 0–5)

## 2019-09-09 LAB — PREGNANCY, URINE: Preg Test, Ur: NEGATIVE

## 2019-09-09 LAB — LIPASE, BLOOD: Lipase: 25 U/L (ref 11–51)

## 2019-09-09 MED ORDER — ONDANSETRON HCL 4 MG/2ML IJ SOLN: 4.0000 mg | Freq: Once | INTRAMUSCULAR | Status: DC

## 2019-09-09 MED ORDER — HALOPERIDOL LACTATE 5 MG/ML IJ SOLN
5.0000 mg | Freq: Once | INTRAMUSCULAR | Status: AC
  Administered 2019-09-09: 5 mg via INTRAVENOUS
  Filled 2019-09-09: qty 1

## 2019-09-09 MED ORDER — SODIUM CHLORIDE 0.9 % IV BOLUS
2000.0000 mL | Freq: Once | INTRAVENOUS | Status: AC
  Administered 2019-09-09: 2000 mL via INTRAVENOUS

## 2019-09-09 MED ORDER — CEFTRIAXONE SODIUM 250 MG IJ SOLR
250.0000 mg | Freq: Once | INTRAMUSCULAR | Status: AC
  Administered 2019-09-09: 250 mg via INTRAMUSCULAR
  Filled 2019-09-09: qty 250

## 2019-09-09 MED ORDER — SODIUM CHLORIDE 0.9 % IV BOLUS: 1000.0000 mL | Freq: Once | INTRAVENOUS | Status: DC

## 2019-09-09 MED ORDER — AZITHROMYCIN 1 G PO PACK
1.0000 g | PACK | Freq: Once | ORAL | Status: AC
  Administered 2019-09-09: 1 g via ORAL
  Filled 2019-09-09: qty 1

## 2019-09-09 MED ORDER — ONDANSETRON 4 MG PO TBDP
4.0000 mg | ORAL_TABLET | Freq: Once | ORAL | Status: AC
  Administered 2019-09-09: 08:00:00 4 mg via ORAL
  Filled 2019-09-09: qty 1

## 2019-09-09 MED ORDER — PROMETHAZINE HCL 25 MG PO TABS: 25.0000 mg | ORAL_TABLET | Freq: Four times a day (QID) | ORAL | 0 refills | Status: DC | PRN

## 2019-09-09 NOTE — ED Provider Notes (Signed)
[place] EMERGENCY DEPARTMENT Provider Note   CSN: [id number] Arrival date & time: 09/09/19  0710     History   Chief Complaint Chief Complaint  Patient presents with  . Emesis  . Abdominal Pain    HPI Laura Duncan is a 27 y.o. female.     The history is provided by the patient.  Abdominal Pain Pain location:  Generalized Pain quality: aching and bloating   Pain radiates to:  Does not radiate Pain severity:  Mild Onset quality:  Gradual Duration:  1 week Timing:  Intermittent Progression:  Waxing and waning Chronicity:  Recurrent Context: not alcohol use and not eating   Relieved by:  Nothing Worsened by:  Nothing Associated symptoms: nausea and vomiting   Associated symptoms: no chest pain, no chills, no constipation, no cough, no diarrhea, no dysuria, no fever, no hematuria, no shortness of breath, no sore throat, no vaginal bleeding and no vaginal discharge   Risk factors: has not had multiple surgeries and not pregnant   Risk factors comment:  THC use   Past Medical History:  Diagnosis Date  . Hyperemesis     Patient Active Problem List   Diagnosis Date Noted  . Hyperemesis 06/02/2016    Past Surgical History:  Procedure Laterality Date  . CESAREAN SECTION    . INDUCED ABORTION       OB History    Gravida  4   Para  2   Term      Preterm  2   AB  1   Living  2     SAB      TAB  1   Ectopic      Multiple      Live Births               Home Medications    Prior to Admission medications   Medication Sig Start Date End Date Taking? Authorizing Provider  ondansetron (ZOFRAN) 8 MG tablet Take 1 tablet (8 mg total) by mouth every 8 (eight) hours as needed for nausea or vomiting. 08/25/16   Mikell, Jeani Sow, MD  promethazine (PHENERGAN) 25 MG tablet Take 1 tablet (25 mg total) by mouth every 6 (six) hours as needed for up to 15 doses for nausea or vomiting. 09/09/19   Lennice Sites, DO    Family History  History reviewed. No pertinent family history.  Social History Social History   Tobacco Use  . Smoking status: Former Smoker    Types: Cigarettes  . Smokeless tobacco: Never Used  Substance Use Topics  . Alcohol use: No  . Drug use: No     Allergies   Patient has no known allergies.   Review of Systems Review of Systems  Constitutional: Negative for chills and fever.  HENT: Negative for ear pain and sore throat.   Eyes: Negative for pain and visual disturbance.  Respiratory: Negative for cough and shortness of breath.   Cardiovascular: Negative for chest pain and palpitations.  Gastrointestinal: Positive for abdominal pain, nausea and vomiting. Negative for abdominal distention, anal bleeding, blood in stool, constipation and diarrhea.  Genitourinary: Negative for dysuria, hematuria, vaginal bleeding and vaginal discharge.  Musculoskeletal: Negative for arthralgias and back pain.  Skin: Negative for color change and rash.  Neurological: Negative for seizures and syncope.  All other systems reviewed and are negative.    Physical Exam Updated Vital Signs BP (!) 179/99   Pulse 79   Temp 98.9 F (37.2 C) (  Oral)   Resp 20   SpO2 100%   Physical Exam Vitals signs and nursing note reviewed.  Constitutional:      General: She is not in acute distress.    Appearance: She is well-developed.  HENT:     Head: Normocephalic and atraumatic.     Mouth/Throat:     Mouth: Mucous membranes are moist.  Eyes:     Extraocular Movements: Extraocular movements intact.     Conjunctiva/sclera: Conjunctivae normal.     Pupils: Pupils are equal, round, and reactive to light.  Neck:     Musculoskeletal: Neck supple.  Cardiovascular:     Rate and Rhythm: Normal rate and regular rhythm.     Heart sounds: Normal heart sounds. No murmur.  Pulmonary:     Effort: Pulmonary effort is normal. No respiratory distress.     Breath sounds: Normal breath sounds.  Abdominal:     General:  Abdomen is flat.     Palpations: Abdomen is soft.     Tenderness: There is generalized abdominal tenderness. There is no right CVA tenderness, left CVA tenderness, guarding or rebound. Negative signs include Murphy's sign, Rovsing's sign and McBurney's sign.  Genitourinary:    Vagina: Normal.     Cervix: No cervical motion tenderness or discharge.     Uterus: Normal.   Skin:    General: Skin is warm and dry.  Neurological:     Mental Status: She is alert.      ED Treatments / Results  Labs (all labs ordered are listed, but only abnormal results are displayed) Labs Reviewed  WET PREP, GENITAL - Abnormal; Notable for the following components:      Result Value   WBC, Wet Prep HPF POC FEW (*)    All other components within normal limits  COMPREHENSIVE METABOLIC PANEL - Abnormal; Notable for the following components:   Potassium 3.2 (*)    CO2 18 (*)    Glucose, Bld 108 (*)    Anion gap 19 (*)    All other components within normal limits  URINALYSIS, ROUTINE W REFLEX MICROSCOPIC - Abnormal; Notable for the following components:   APPearance HAZY (*)    pH 8.5 (*)    Bilirubin Urine MODERATE (*)    Ketones, ur >80 (*)    Protein, ur 30 (*)    All other components within normal limits  RAPID URINE DRUG SCREEN, HOSP PERFORMED - Abnormal; Notable for the following components:   Tetrahydrocannabinol POSITIVE (*)    All other components within normal limits  URINALYSIS, MICROSCOPIC (REFLEX) - Abnormal; Notable for the following components:   Bacteria, UA MANY (*)    All other components within normal limits  CBC WITH DIFFERENTIAL/PLATELET  LIPASE, BLOOD  PREGNANCY, URINE  GC/CHLAMYDIA PROBE AMP ([place]) NOT AT [facility]    EKG None  Radiology No results found.  Procedures Procedures (including critical care time)  Medications Ordered in ED Medications  cefTRIAXone (ROCEPHIN) injection 250 mg (has no administration in time range)  azithromycin (ZITHROMAX) powder 1 g  (has no administration in time range)  haloperidol lactate (HALDOL) injection 5 mg (5 mg Intravenous Given 09/09/19 0754)  ondansetron (ZOFRAN-ODT) disintegrating tablet 4 mg (4 mg Oral Given 09/09/19 0800)  sodium chloride 0.9 % bolus 2,000 mL (2,000 mLs Intravenous New Bag/Given 09/09/19 0755)     Initial Impression / Assessment and Plan / ED Course  I have reviewed the triage vital signs and the nursing notes.  Pertinent labs &  imaging results that were available during my care of the patient were reviewed by me and considered in my medical decision making (see chart for details).     Lewie ChamberChristina Hansell is a 28 year old female history of hyperemesis who presents to the ED with hyperemesis.  Patient with unremarkable vitals.  No fever.  Patient with ongoing symptoms for the last week.  Recently went to emergency department 3 days ago for the same.  Had unremarkable lab work, had normal CT scan of her abdomen and pelvis.  Had negative pregnancy test.  She appears to have the symptoms secondary to marijuana use.  She admits to continued marijuana use.  Denies any focal abdominal pain.  Overall has cramping pain in her abdomen and continues to feel nauseous but no vomiting.  Denies any vaginal discharge.  Has no focal tenderness on abdominal exam but appears to have some generalized tenderness.  No signs of peritonitis.  Denies any urinary symptoms.  Will evaluate with basic labs, urinalysis.  Will treat with IV fluids, IV Haldol, Zofran and reevaluate.  On reevaluation patient is concerned about STDs and will perform pelvic exam.  She is feeling much better.  No longer has nauseous.  Lab work shows no significant anemia, electrolyte abnormality, kidney injury.  Abdominal exam is nonfocal.  Urinalysis negative for infection.  Wet prep unremarkable.  Treated empirically for gonorrhea and chlamydia.  Overall patient much improved following IV fluids and IV antiemetics.  Given prescription for Phenergan.   Discharged in the ED in good condition.  Patient likely with hyperemesis secondary to marijuana use.  No abdominal tenderness on repeat exam.  No concern for appendicitis or other intra-abdominal process.  This chart was dictated using voice recognition software.  Despite best efforts to proofread,  errors can occur which can change the documentation meaning.    Final Clinical Impressions(s) / ED Diagnoses   Final diagnoses:  Vomiting without nausea, intractability of vomiting not specified, unspecified vomiting type    ED Discharge Orders         Ordered    promethazine (PHENERGAN) 25 MG tablet  Every 6 hours PRN     09/09/19 0909           Virgina Norfolkuratolo, Carlynn Leduc, DO 09/09/19 [phone]

## 2019-09-09 NOTE — ED Triage Notes (Signed)
PT states she needs a "shot in her butt" becauase her husband is cheating. Has lower abdominal pain and emesis x 1 week.

## 2019-09-09 NOTE — ED Notes (Signed)
Pt is going to try to tolerate ice chips before she takes the azithromycin.

## 2019-09-11 LAB — CERVICOVAGINAL ANCILLARY ONLY
Chlamydia: NEGATIVE
Neisseria Gonorrhea: NEGATIVE

## 2019-10-12 ENCOUNTER — Emergency Department (HOSPITAL_BASED_OUTPATIENT_CLINIC_OR_DEPARTMENT_OTHER): Payer: Medicaid Other

## 2019-10-12 ENCOUNTER — Emergency Department (HOSPITAL_BASED_OUTPATIENT_CLINIC_OR_DEPARTMENT_OTHER)
Admission: EM | Admit: 2019-10-12 | Discharge: 2019-10-12 | Disposition: A | Discharge status: Home / Self Care | Payer: Medicaid Other | Attending: Emergency Medicine | Admitting: Emergency Medicine

## 2019-10-12 ENCOUNTER — Encounter (HOSPITAL_BASED_OUTPATIENT_CLINIC_OR_DEPARTMENT_OTHER): Payer: Self-pay

## 2019-10-12 ENCOUNTER — Other Ambulatory Visit: Payer: Self-pay

## 2019-10-12 DIAGNOSIS — R1110 Vomiting, unspecified: Secondary | ICD-10-CM | POA: Insufficient documentation

## 2019-10-12 DIAGNOSIS — Z87891 Personal history of nicotine dependence: Secondary | ICD-10-CM | POA: Insufficient documentation

## 2019-10-12 DIAGNOSIS — R0789 Other chest pain: Secondary | ICD-10-CM | POA: Insufficient documentation

## 2019-10-12 DIAGNOSIS — N939 Abnormal uterine and vaginal bleeding, unspecified: Secondary | ICD-10-CM | POA: Diagnosis not present

## 2019-10-12 DIAGNOSIS — R1013 Epigastric pain: Secondary | ICD-10-CM | POA: Diagnosis present

## 2019-10-12 LAB — COMPREHENSIVE METABOLIC PANEL
ALT: 57 U/L — ABNORMAL HIGH (ref 0–44)
AST: 47 U/L — ABNORMAL HIGH (ref 15–41)
Albumin: 4.8 g/dL (ref 3.5–5.0)
Alkaline Phosphatase: 53 U/L (ref 38–126)
Anion gap: 13 (ref 5–15)
BUN: 15 mg/dL (ref 6–20)
CO2: 23 mmol/L (ref 22–32)
Calcium: 9.7 mg/dL (ref 8.9–10.3)
Chloride: 100 mmol/L (ref 98–111)
Creatinine, Ser: 0.81 mg/dL (ref 0.44–1.00)
GFR calc Af Amer: >60 mL/min (ref >60)
GFR calc non Af Amer: >60 mL/min (ref >60)
Glucose, Bld: 125 mg/dL — ABNORMAL HIGH (ref 70–99)
Potassium: 3.2 mmol/L — ABNORMAL LOW (ref 3.5–5.1)
Sodium: 136 mmol/L (ref 135–145)
Total Bilirubin: 1.6 mg/dL — ABNORMAL HIGH (ref 0.3–1.2)
Total Protein: 8.4 g/dL — ABNORMAL HIGH (ref 6.5–8.1)

## 2019-10-12 LAB — CBC WITH DIFFERENTIAL/PLATELET
Abs Immature Granulocytes: 0.08 10*3/uL — ABNORMAL HIGH (ref 0.00–0.07)
Basophils Absolute: 0 10*3/uL (ref 0.0–0.1)
Basophils Relative: 0 %
Eosinophils Absolute: 0 10*3/uL (ref 0.0–0.5)
Eosinophils Relative: 0 %
HCT: 44.1 % (ref 36.0–46.0)
Hemoglobin: 15 g/dL (ref 12.0–15.0)
Immature Granulocytes: 1 %
Lymphocytes Relative: 21 %
Lymphs Abs: 2.6 10*3/uL (ref 0.7–4.0)
MCH: 27.1 pg (ref 26.0–34.0)
MCHC: 34 g/dL (ref 30.0–36.0)
MCV: 79.6 fL — ABNORMAL LOW (ref 80.0–100.0)
Monocytes Absolute: 0.8 10*3/uL (ref 0.1–1.0)
Monocytes Relative: 6 %
Neutro Abs: 8.9 10*3/uL — ABNORMAL HIGH (ref 1.7–7.7)
Neutrophils Relative %: 72 %
Platelets: 334 10*3/uL (ref 150–400)
RBC: 5.54 MIL/uL — ABNORMAL HIGH (ref 3.87–5.11)
RDW: 12.8 % (ref 11.5–15.5)
WBC: 12.4 10*3/uL — ABNORMAL HIGH (ref 4.0–10.5)
nRBC: 0 % (ref 0.0–0.2)

## 2019-10-12 LAB — URINALYSIS, ROUTINE W REFLEX MICROSCOPIC
Glucose, UA: NEGATIVE mg/dL
Ketones, ur: 40 mg/dL — AB
Leukocytes,Ua: NEGATIVE
Nitrite: NEGATIVE
Protein, ur: 100 mg/dL — AB
Specific Gravity, Urine: 1.025 (ref 1.005–1.030)
pH: 6 (ref 5.0–8.0)

## 2019-10-12 LAB — HCG, QUANTITATIVE, PREGNANCY: hCG, Beta Chain, Quant, S: 3992 m[IU]/mL — ABNORMAL HIGH (ref <5)

## 2019-10-12 LAB — PREGNANCY, URINE: Preg Test, Ur: POSITIVE — AB

## 2019-10-12 LAB — WET PREP, GENITAL
Sperm: NONE SEEN
Trich, Wet Prep: NONE SEEN
Yeast Wet Prep HPF POC: NONE SEEN

## 2019-10-12 LAB — URINALYSIS, MICROSCOPIC (REFLEX): RBC / HPF: >50 RBC/hpf (ref 0–5)

## 2019-10-12 LAB — LIPASE, BLOOD: Lipase: 31 U/L (ref 11–51)

## 2019-10-12 MED ORDER — SODIUM CHLORIDE 0.9 % IV BOLUS (SEPSIS)
1000.0000 mL | Freq: Once | INTRAVENOUS | Status: AC
  Administered 2019-10-12: 1000 mL via INTRAVENOUS

## 2019-10-12 MED ORDER — ONDANSETRON 4 MG PO TBDP: 4.0000 mg | ORAL_TABLET | Freq: Three times a day (TID) | ORAL | 0 refills | Status: DC | PRN

## 2019-10-12 MED ORDER — FAMOTIDINE 20 MG PO TABS: 20.0000 mg | ORAL_TABLET | Freq: Two times a day (BID) | ORAL | 0 refills | Status: AC

## 2019-10-12 MED ORDER — ONDANSETRON HCL 4 MG/2ML IJ SOLN
4.0000 mg | Freq: Once | INTRAMUSCULAR | Status: AC
  Administered 2019-10-12: 14:00:00 4 mg via INTRAVENOUS

## 2019-10-12 MED ORDER — ONDANSETRON HCL 4 MG/2ML IJ SOLN
INTRAMUSCULAR | Status: AC
  Filled 2019-10-12: qty 2

## 2019-10-12 MED ORDER — MORPHINE SULFATE (PF) 4 MG/ML IV SOLN
4.0000 mg | Freq: Once | INTRAVENOUS | Status: AC
  Administered 2019-10-12: 4 mg via INTRAVENOUS
  Filled 2019-10-12: qty 1

## 2019-10-12 NOTE — Discharge Instructions (Signed)
Please read and follow all provided instructions.  Your diagnoses today include:  1. Epigastric pain     Tests performed today include:  Blood counts and electrolytes  Blood tests to check liver and kidney function  Blood tests to check pancreas function  Urine test to look for infection and pregnancy (in women)  Ultrasound - no problems from your procedure  X-ray - no pneumonia or other problems  Vital signs. See below for your results today.   Medications prescribed:   Zofran (ondansetron) - for nausea and vomiting   Pepcid (famotidine) - antihistamine  You can find this medication over-the-counter.   DO NOT exceed:   20mg  Pepcid every 12 hours  Take any prescribed medications only as directed.  Home care instructions:   Follow any educational materials contained in this packet.  Follow-up instructions: Please follow-up with your primary care provider in the next 3 days for further evaluation of your symptoms.    Return instructions:  SEEK IMMEDIATE MEDICAL ATTENTION IF:  The pain does not go away or becomes severe   A temperature above 101F develops   Repeated vomiting occurs (multiple episodes)   The pain becomes localized to portions of the abdomen. The right side could possibly be appendicitis. In an adult, the left lower portion of the abdomen could be colitis or diverticulitis.   Blood is being passed in stools or vomit (bright red or black tarry stools)   You develop chest pain, difficulty breathing, dizziness or fainting, or become confused, poorly responsive, or inconsolable (young children)  If you have any other emergent concerns regarding your health  Additional Information: Abdominal (belly) pain can be caused by many things. Your caregiver performed an examination and possibly ordered blood/urine tests and imaging (CT scan, x-rays, ultrasound). Many cases can be observed and treated at home after initial evaluation in the emergency  department. Even though you are being discharged home, abdominal pain can be unpredictable. Therefore, you need a repeated exam if your pain does not resolve, returns, or worsens. Most patients with abdominal pain don't have to be admitted to the hospital or have surgery, but serious problems like appendicitis and gallbladder attacks can start out as nonspecific pain. Many abdominal conditions cannot be diagnosed in one visit, so follow-up evaluations are very important.  Your vital signs today were: BP 119/88    Pulse 84    Temp 98.6 F (37 C) (Oral)    Resp 18    Ht 5\' 5"  (1.651 m)    Wt 77.1 kg    SpO2 100%    BMI 28.29 kg/m  If your blood pressure (bp) was elevated above 135/85 this visit, please have this repeated by your doctor within one month. --------------

## 2019-10-12 NOTE — ED Triage Notes (Addendum)
Pt c/o n/v, lower abd pain x 3 days-started after "surgical abortion"-NAD-to triage in w/c

## 2019-10-12 NOTE — ED Notes (Signed)
Pt returned from ultrasound

## 2019-10-12 NOTE — ED Notes (Signed)
Pt provided ginger ale and water for PO challenge.

## 2019-10-12 NOTE — ED Notes (Signed)
Patient transported to X-ray 

## 2019-10-12 NOTE — ED Provider Notes (Signed)
[facility] EMERGENCY DEPARTMENT Provider Note   CSN: [id number] Arrival date & time: 10/12/19  1302     History   Chief Complaint Chief Complaint  Patient presents with  . Vomiting    HPI Laura Duncan is a 28 y.o. female.     Patient presents to the emergency department with complaint of epigastric pain, vomiting, chest pain, vaginal bleeding 3 days after having a surgical abortion.  Patient states that she was [facility].  She received IV sedation during the procedure.  She states that after the procedure she had significant nausea and vomiting which has persisted.  Pain is worse in the epigastrium.  She has some milder pelvic pain.  She has some mild vaginal bleeding without other discharge.  She has burning pain in her chest that is worse with vomiting.  She states that it is difficult to take a deep breath at times because of the pain.  No fevers or cough.  She has had history of cesarean section however no other abdominal surgeries.  No irritative urinary symptoms.  No treatments prior to arrival other than Zofran.  This does not seem to help much.      Past Medical History:  Diagnosis Date  . Hyperemesis     Patient Active Problem List   Diagnosis Date Noted  . Hyperemesis 06/02/2016    Past Surgical History:  Procedure Laterality Date  . CESAREAN SECTION    . INDUCED ABORTION       OB History    Gravida  4   Para  2   Term      Preterm  2   AB  1   Living  2     SAB      TAB  1   Ectopic      Multiple      Live Births               Home Medications    Prior to Admission medications   Medication Sig Start Date End Date Taking? Authorizing Provider  ondansetron (ZOFRAN) 8 MG tablet Take 1 tablet (8 mg total) by mouth every 8 (eight) hours as needed for nausea or vomiting. 08/25/16   Mikell, Antionette Poles, MD  promethazine (PHENERGAN) 25 MG tablet Take 1 tablet (25 mg total) by  mouth every 6 (six) hours as needed for up to 15 doses for nausea or vomiting. 09/09/19   Virgina Norfolk, DO    Family History No family history on file.  Social History Social History   Tobacco Use  . Smoking status: Former Smoker    Types: Cigarettes  . Smokeless tobacco: Never Used  Substance Use Topics  . Alcohol use: No  . Drug use: No     Allergies   Patient has no known allergies.   Review of Systems Review of Systems  Constitutional: Negative for fever.  HENT: Negative for rhinorrhea and sore throat.   Eyes: Negative for redness.  Respiratory: Negative for cough.   Cardiovascular: Positive for chest pain.  Gastrointestinal: Positive for abdominal pain, nausea and vomiting. Negative for diarrhea.  Genitourinary: Positive for pelvic pain and vaginal bleeding. Negative for dysuria and vaginal discharge.  Musculoskeletal: Negative for myalgias.  Skin: Negative for rash.  Neurological: Negative for headaches.     Physical Exam Updated Vital Signs BP 132/87 (BP Location: Right Arm)   Pulse 83   Temp 98.6 F (37 C) (Oral)  Resp 16   Ht  (1.651 m)   Wt 77.1 kg   SpO2 100%   BMI 28.29 kg/m   Physical Exam Vitals signs and nursing note reviewed. Exam conducted with a chaperone present.  Constitutional:      General: She is in acute distress (Uncomfortable appearing).     Appearance: She is well-developed.  HENT:     Head: Normocephalic and atraumatic.  Eyes:     General:        Right eye: No discharge.        Left eye: No discharge.     Conjunctiva/sclera: Conjunctivae normal.  Neck:     Musculoskeletal: Normal range of motion and neck supple.  Cardiovascular:     Rate and Rhythm: Normal rate and regular rhythm.     Heart sounds: Normal heart sounds.  Pulmonary:     Effort: Pulmonary effort is normal.     Breath sounds: Normal breath sounds.  Abdominal:     Palpations: Abdomen is soft.     Tenderness: There is abdominal tenderness. There is  no guarding or rebound.     Comments: Patient was mild to moderate tenderness in the epigastrium and mild tenderness in the suprapubic area.  No rebound or guarding.  Genitourinary:    Exam position: Lithotomy position.     Labia:        Right: No rash.        Left: No rash.      Vagina: No tenderness.     Cervix: Cervical bleeding (from cervix) present. No cervical motion tenderness.     Uterus: Enlarged and tender.      Adnexa:        Right: Tenderness present. No mass.         Left: No mass or tenderness.    Skin:    General: Skin is warm and dry.  Neurological:     Mental Status: She is alert.      ED Treatments / Results  Labs (all labs ordered are listed, but only abnormal results are displayed) Labs Reviewed  WET PREP, GENITAL - Abnormal; Notable for the following components:      Result Value   Clue Cells Wet Prep HPF POC PRESENT (*)    WBC, Wet Prep HPF POC FEW (*)    All other components within normal limits  COMPREHENSIVE METABOLIC PANEL - Abnormal; Notable for the following components:   Potassium 3.2 (*)    Glucose, Bld 125 (*)    Total Protein 8.4 (*)    AST 47 (*)    ALT 57 (*)    Total Bilirubin 1.6 (*)    All other components within normal limits  CBC WITH DIFFERENTIAL/PLATELET - Abnormal; Notable for the following components:   WBC 12.4 (*)    RBC 5.54 (*)    MCV 79.6 (*)    Neutro Abs 8.9 (*)    Abs Immature Granulocytes 0.08 (*)    All other components within normal limits  PREGNANCY, URINE - Abnormal; Notable for the following components:   Preg Test, Ur POSITIVE (*)    All other components within normal limits  URINALYSIS, ROUTINE W REFLEX MICROSCOPIC - Abnormal; Notable for the following components:   Color, Urine AMBER (*)    APPearance CLOUDY (*)    Hgb urine dipstick LARGE (*)    Bilirubin Urine MODERATE (*)    Ketones, ur 40 (*)    Protein, ur 100 (*)  All other components within normal limits  HCG, QUANTITATIVE, PREGNANCY -  Abnormal; Notable for the following components:   hCG, Beta Chain, Quant, S 3,992 (*)    All other components within normal limits  URINALYSIS, MICROSCOPIC (REFLEX) - Abnormal; Notable for the following components:   Bacteria, UA MANY (*)    All other components within normal limits  LIPASE, BLOOD  GC/CHLAMYDIA PROBE AMP ([place]) NOT AT [facility]    EKG None  Radiology Dg Abd Acute 2+v W 1v Chest  Result Date: 10/12/2019 CLINICAL DATA:  Vomiting epigastric pain., post procedure on 10/09/2019 by report. EXAM: DG ABDOMEN ACUTE W/ 1V CHEST COMPARISON:  CT abdomen and pelvis from 2017 FINDINGS: Lungs are clear. Cardiomediastinal contours pulmonary vasculature and osseous structures in the chest are unremarkable. No free air beneath either right or left hemidiaphragm. No signs of air-fluid levels in the abdomen or pelvis. Scattered stool and gas throughout the colon to the level of the rectum. No acute bone process over the abdomen or pelvis. IMPRESSION: No acute abnormalities identified in the chest or abdomen. The patient's bowel gas pattern is nonobstructive. Electronically Signed   By: Zetta Bills M.D.   On: 10/12/2019 15:06    Procedures Procedures (including critical care time)  Medications Ordered in ED Medications  ondansetron (ZOFRAN) injection 4 mg (4 mg Intravenous Given 10/12/19 1416)  sodium chloride 0.9 % bolus 1,000 mL (0 mLs Intravenous Stopped 10/12/19 1539)  morphine 4 MG/ML injection 4 mg (4 mg Intravenous Given 10/12/19 1431)     Initial Impression / Assessment and Plan / ED Course  I have reviewed the triage vital signs and the nursing notes.  Pertinent labs & imaging results that were available during my care of the patient were reviewed by me and considered in my medical decision making (see chart for details).        Patient seen and examined.  We will obtain x-ray to evaluate for any signs of free air.  Will perform pelvic exam.  May need ultrasound.   Labs pending.  Zofran given prior.  Morphine ordered for pain as patient appears uncomfortable.   Vital signs reviewed and are as follows: BP 132/87 (BP Location: Right Arm)   Pulse 83   Temp 98.6 F (37 C) (Oral)   Resp 16   Ht 5\' 5"  (1.651 m)   Wt 77.1 kg   SpO2 100%   BMI 28.29 kg/m   3:44 PM Pelvic with RN chaperone. Pt stable.   5:01 PM [place] without any signs of retained products.  Labs are overall reassuring.  Mildly elevated white blood cell count.  Mildly elevated liver enzymes.  Lipase is normal.  Plain film x-rays without any signs of free air.  Will attempt p.o. challenge and reassess.  Suspect UA is contaminated and does not represent infection.  5:05 PM patient updated. She looks well. Fluid challenge at bedside.   6:15 PM Patient drinking water. Pain still in epigastrium. No rebound or guarding. Plan to d/c with pepcid, zofran, clear liquids for 24 hrs, then bland diet. We discussed strict return instructions.   The patient was urged to return to the Emergency Department immediately with worsening of current symptoms, worsening abdominal pain, persistent vomiting, blood noted in stools, fever, or any other concerns. The patient verbalized understanding.    Final Clinical Impressions(s) / ED Diagnoses   Final diagnoses:  Epigastric pain   Patient presents with complaint of nausea, vomiting, epigastric pain, pelvic pain, and vaginal  bleeding.  Symptoms started right after procedure, surgical abortion.  Vitals are stable, no fever. Labs with mild elevation in white blood cell count, mild elevation in liver enzymes, normal lipase. Imaging without signs of free air, no signs of retained products of conception.  Pelvic exam with mild bleeding from the cervical os consistent with recent procedure.  No signs of dehydration, patient is tolerating PO's after treatment with antiemetic and IV fluids in the emergency department.  She is feeling a bit better.  Lungs are clear and no signs  suggestive of PNA on x-ray. Low concern for appendicitis, cholecystitis, pancreatitis, ruptured viscus, UTI, kidney stone, aortic dissection, aortic aneurysm or other emergent abdominal etiology.  Considered gallbladder disease, however timing makes this less suspicious and patient does not have focal right upper quadrant tenderness.  Supportive therapy indicated with return if symptoms worsen.     ED Discharge Orders    None       Renne CriglerGeiple, Damiyah Ditmars, PA-C 10/12/19 1828    Little, Ambrose Finlandachel Morgan, MD 10/13/19 [phone]

## 2019-10-12 NOTE — ED Notes (Signed)
Pt reports no vomiting but still slightly nauseous after water.

## 2019-10-16 LAB — GC/CHLAMYDIA PROBE AMP (~~LOC~~) NOT AT ARMC
Chlamydia: NEGATIVE
Neisseria Gonorrhea: NEGATIVE

## 2020-02-16 IMAGING — DX DG ABDOMEN ACUTE W/ 1V CHEST
3 series · 3 of 3 positions shown · non-contrast
Comparison: CT abdomen and pelvis from 6853

CLINICAL DATA: Vomiting epigastric pain., post procedure on
10/09/2019 by report.

EXAM:
DG ABDOMEN ACUTE W/ 1V CHEST

[chest pa]
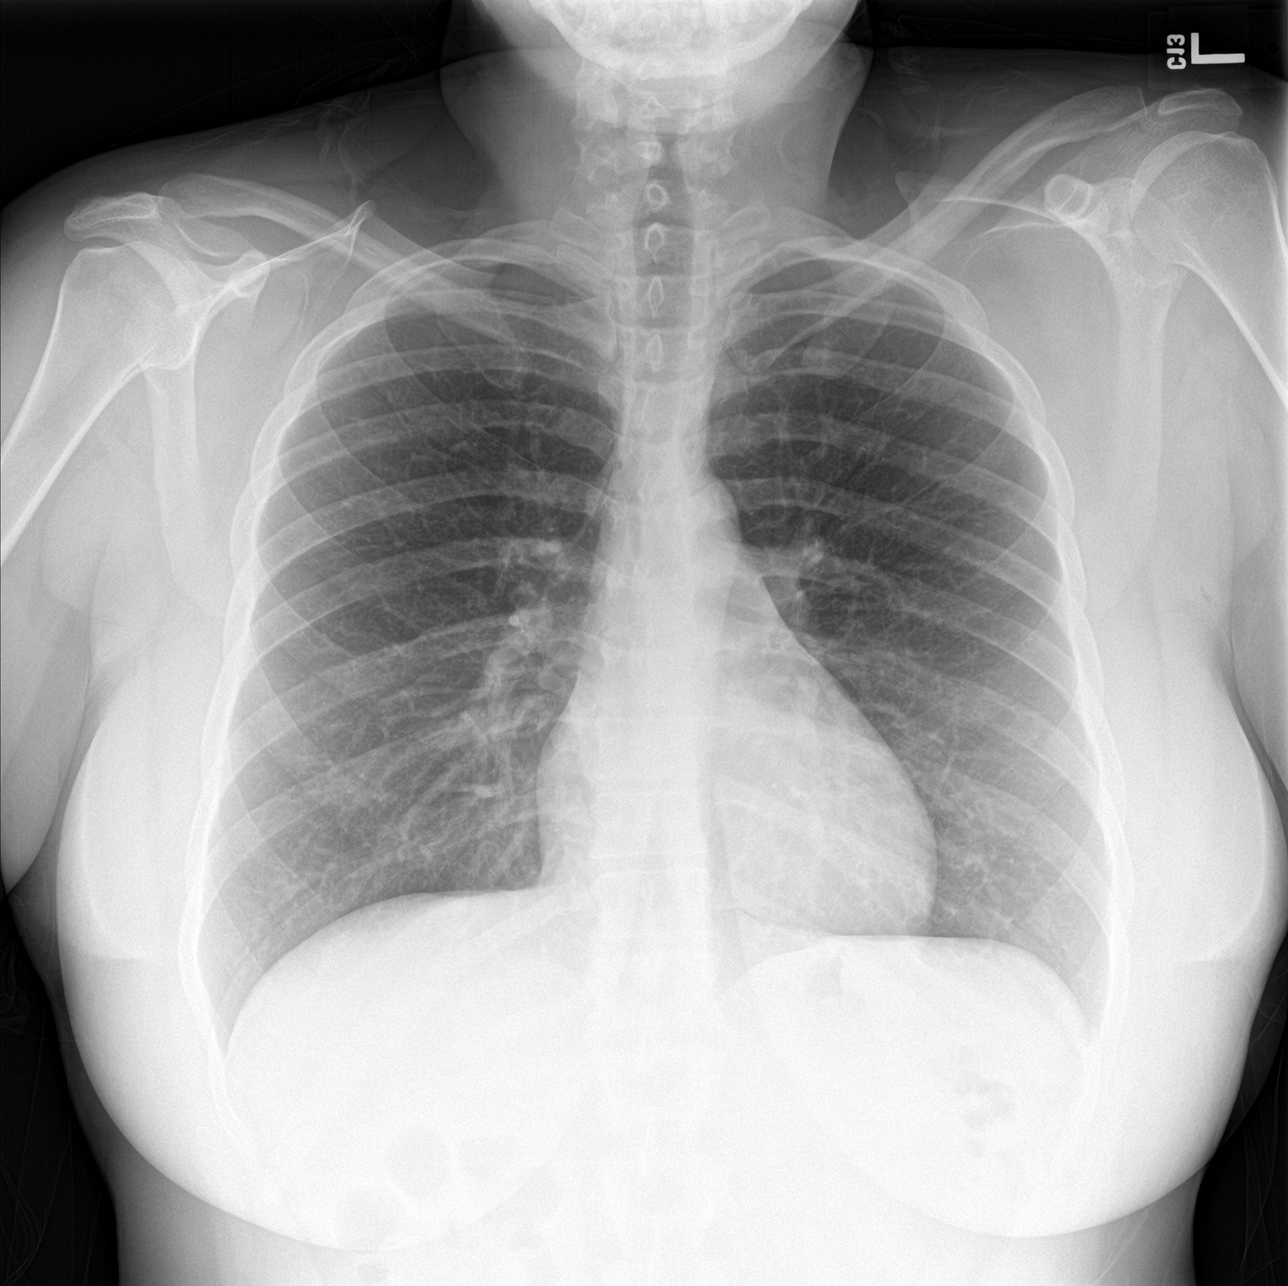

[abdomen erect]
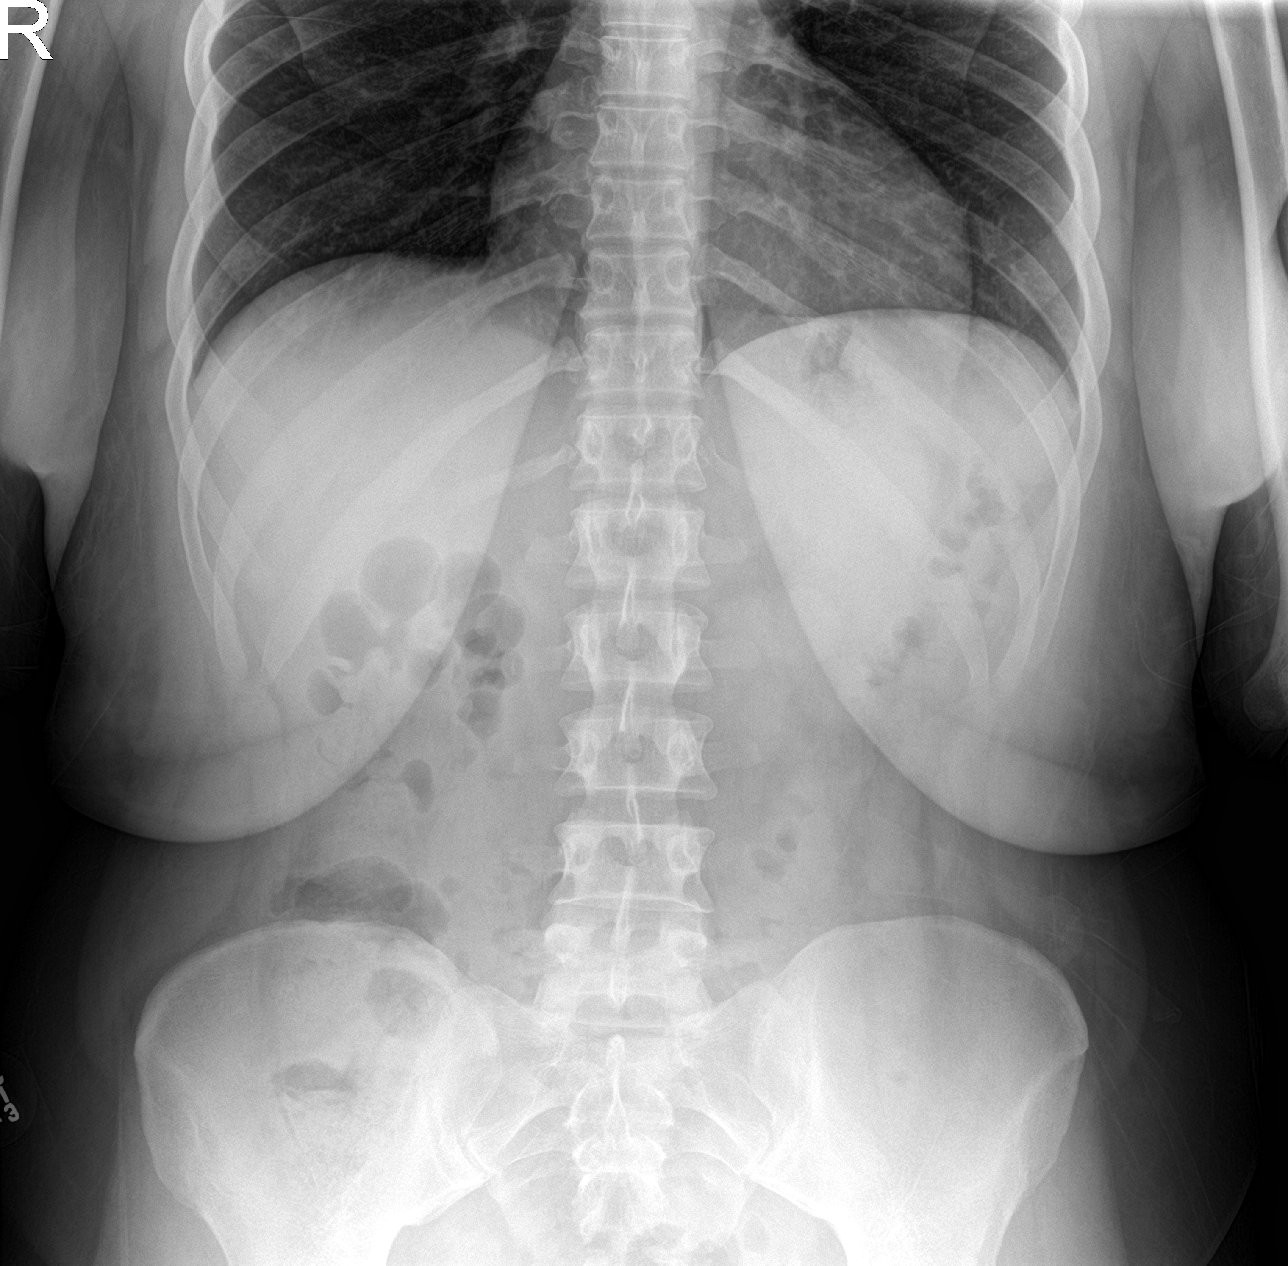

[abdomen kub]
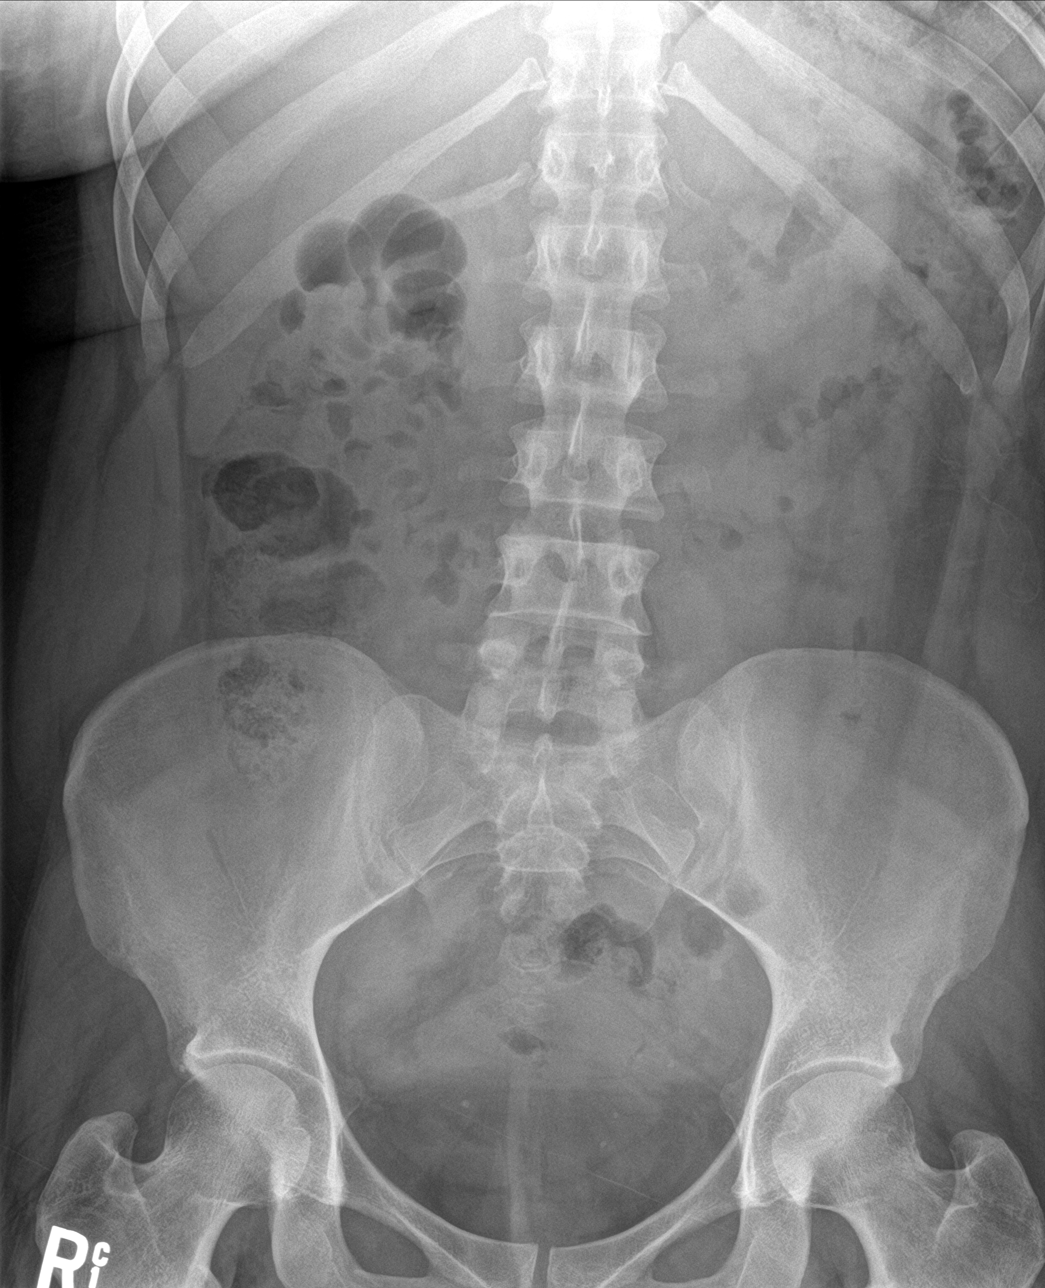

[3 of 3 positions shown; findings below may reference images not displayed]

FINDINGS: Lungs are clear. Cardiomediastinal contours pulmonary vasculature
and osseous structures in the chest are unremarkable.

No free air beneath either right or left hemidiaphragm. No signs of
air-fluid levels in the abdomen or pelvis.

Scattered stool and gas throughout the colon to the level of the
rectum. No acute bone process over the abdomen or pelvis.
IMPRESSION: No acute abnormalities identified in the chest or abdomen. The
patient's bowel gas pattern is nonobstructive.

## 2020-04-16 ENCOUNTER — Encounter (HOSPITAL_BASED_OUTPATIENT_CLINIC_OR_DEPARTMENT_OTHER): Payer: Self-pay | Admitting: *Deleted

## 2020-04-16 ENCOUNTER — Emergency Department (HOSPITAL_BASED_OUTPATIENT_CLINIC_OR_DEPARTMENT_OTHER): Payer: Medicaid Other

## 2020-04-16 ENCOUNTER — Other Ambulatory Visit: Payer: Self-pay

## 2020-04-16 ENCOUNTER — Emergency Department (HOSPITAL_BASED_OUTPATIENT_CLINIC_OR_DEPARTMENT_OTHER)
Admission: EM | Admit: 2020-04-16 | Discharge: 2020-04-16 | Disposition: A | Discharge status: Home / Self Care | Payer: Medicaid Other | Attending: Emergency Medicine | Admitting: Emergency Medicine

## 2020-04-16 DIAGNOSIS — Z79899 Other long term (current) drug therapy: Secondary | ICD-10-CM | POA: Diagnosis not present

## 2020-04-16 DIAGNOSIS — K529 Noninfective gastroenteritis and colitis, unspecified: Secondary | ICD-10-CM | POA: Diagnosis not present

## 2020-04-16 DIAGNOSIS — R1084 Generalized abdominal pain: Secondary | ICD-10-CM | POA: Diagnosis present

## 2020-04-16 HISTORY — DX: Cannabis abuse, uncomplicated: F12.10

## 2020-04-16 LAB — URINALYSIS, ROUTINE W REFLEX MICROSCOPIC
Bilirubin Urine: NEGATIVE
Glucose, UA: NEGATIVE mg/dL
Hgb urine dipstick: NEGATIVE
Ketones, ur: >80 mg/dL — AB
Leukocytes,Ua: NEGATIVE
Nitrite: NEGATIVE
Protein, ur: NEGATIVE mg/dL
Specific Gravity, Urine: 1.02 (ref 1.005–1.030)
pH: 7.5 (ref 5.0–8.0)

## 2020-04-16 LAB — CBC WITH DIFFERENTIAL/PLATELET
Abs Immature Granulocytes: 0.11 10*3/uL — ABNORMAL HIGH (ref 0.00–0.07)
Basophils Absolute: 0 10*3/uL (ref 0.0–0.1)
Basophils Relative: 0 %
Eosinophils Absolute: 0 10*3/uL (ref 0.0–0.5)
Eosinophils Relative: 0 %
HCT: 40.9 % (ref 36.0–46.0)
Hemoglobin: 13.8 g/dL (ref 12.0–15.0)
Immature Granulocytes: 1 %
Lymphocytes Relative: 7 %
Lymphs Abs: 1.3 10*3/uL (ref 0.7–4.0)
MCH: 27.4 pg (ref 26.0–34.0)
MCHC: 33.7 g/dL (ref 30.0–36.0)
MCV: 81.2 fL (ref 80.0–100.0)
Monocytes Absolute: 0.9 10*3/uL (ref 0.1–1.0)
Monocytes Relative: 5 %
Neutro Abs: 16.3 10*3/uL — ABNORMAL HIGH (ref 1.7–7.7)
Neutrophils Relative %: 87 %
Platelets: 298 10*3/uL (ref 150–400)
RBC: 5.04 MIL/uL (ref 3.87–5.11)
RDW: 13.2 % (ref 11.5–15.5)
WBC: 18.7 10*3/uL — ABNORMAL HIGH (ref 4.0–10.5)
nRBC: 0 % (ref 0.0–0.2)

## 2020-04-16 LAB — COMPREHENSIVE METABOLIC PANEL
ALT: 18 U/L (ref 0–44)
AST: 26 U/L (ref 15–41)
Albumin: 4.9 g/dL (ref 3.5–5.0)
Alkaline Phosphatase: 52 U/L (ref 38–126)
Anion gap: 14 (ref 5–15)
BUN: 14 mg/dL (ref 6–20)
CO2: 17 mmol/L — ABNORMAL LOW (ref 22–32)
Calcium: 9.6 mg/dL (ref 8.9–10.3)
Chloride: 108 mmol/L (ref 98–111)
Creatinine, Ser: 0.84 mg/dL (ref 0.44–1.00)
GFR calc Af Amer: >60 mL/min (ref >60)
GFR calc non Af Amer: >60 mL/min (ref >60)
Glucose, Bld: 121 mg/dL — ABNORMAL HIGH (ref 70–99)
Potassium: 3.9 mmol/L (ref 3.5–5.1)
Sodium: 139 mmol/L (ref 135–145)
Total Bilirubin: 1.2 mg/dL (ref 0.3–1.2)
Total Protein: 8.6 g/dL — ABNORMAL HIGH (ref 6.5–8.1)

## 2020-04-16 LAB — PREGNANCY, URINE: Preg Test, Ur: NEGATIVE

## 2020-04-16 LAB — LIPASE, BLOOD: Lipase: 27 U/L (ref 11–51)

## 2020-04-16 MED ORDER — SODIUM CHLORIDE 0.9 % IV BOLUS
1000.0000 mL | Freq: Once | INTRAVENOUS | Status: AC
  Administered 2020-04-16: 1000 mL via INTRAVENOUS

## 2020-04-16 MED ORDER — ONDANSETRON HCL 4 MG/2ML IJ SOLN
4.0000 mg | Freq: Once | INTRAMUSCULAR | Status: AC
  Administered 2020-04-16: 4 mg via INTRAVENOUS
  Filled 2020-04-16: qty 2

## 2020-04-16 MED ORDER — IOHEXOL 300 MG/ML  SOLN
100.0000 mL | Freq: Once | INTRAMUSCULAR | Status: AC
  Administered 2020-04-16: 100 mL via INTRAVENOUS

## 2020-04-16 MED ORDER — CIPROFLOXACIN HCL 500 MG PO TABS: 500.0000 mg | ORAL_TABLET | Freq: Two times a day (BID) | ORAL | 0 refills | Status: DC

## 2020-04-16 MED ORDER — LORAZEPAM 2 MG/ML IJ SOLN
1.0000 mg | Freq: Once | INTRAMUSCULAR | Status: AC
  Administered 2020-04-16: 14:00:00 1 mg via INTRAVENOUS
  Filled 2020-04-16: qty 1

## 2020-04-16 MED ORDER — SODIUM CHLORIDE 0.9 % IV SOLN: INTRAVENOUS | Status: DC

## 2020-04-16 MED ORDER — HALOPERIDOL LACTATE 5 MG/ML IJ SOLN
2.0000 mg | Freq: Once | INTRAMUSCULAR | Status: AC
  Administered 2020-04-16: 2 mg via INTRAVENOUS
  Filled 2020-04-16: qty 1

## 2020-04-16 NOTE — Discharge Instructions (Signed)
Work-up today as we discussed shows a lot of inflammation on the first half of your large intestine.  Follow-up with gastroenterology for colonoscopy.  Due to the elevated white count and some low-grade fevers going take the Cipro as directed for the next 7 days.  Return for any new or worse symptoms.

## 2020-04-16 NOTE — ED Notes (Addendum)
Pt sts unable to void at this time, requesting IV fluids first. Pt restless during IV start and refuses to stay still, she reports restlessness due to pain.  Marland Kitchen

## 2020-04-16 NOTE — ED Notes (Signed)
ED Provider at bedside. 

## 2020-04-16 NOTE — ED Notes (Signed)
Pt drinking oral contrast for CT.  Tolerating well.

## 2020-04-16 NOTE — ED Provider Notes (Signed)
Patient CT scan of the abdomen with interesting finding.  Showing colitis cecum ascending transverse colon.  Patient was really without any diarrhea does have some crampy abdominal pain.  Did have a pretty significant leukocytosis with a white count of 18,000.  Temperature here was 99.9.  I think based on that patient warrants short course of antibiotics we will treat with Cipro for 7 days.  Will refer patient to gastroenterology for colonoscopy and further evaluation of this.  Patient's problems in the past have been more hyperemesis type situation.  In this kind of which she presented for was nausea and vomiting.  That is now controlled.  So this may have been a bit of an incidental finding.   Vanetta Mulders, MD 04/16/20 [phone]

## 2020-04-16 NOTE — ED Provider Notes (Signed)
[facility] Provider Note   CSN: [id number] Arrival date & time: 04/16/20  1314     History Chief Complaint  Patient presents with  . Abdominal Cramping    [facility] is a 29 y.o. female.  HPI   Patient presents to the emergency room for evaluation of nausea and vomiting.  Patient has a history of hyperemesis syndrome.  She started having nausea vomiting and abdominal cramping over the last 2 days.  Patient states she has not been able to keep anything down although she was drinking some alcohol over the weekend.  She also tried smoking some marijuana to help with her abdominal cramping today.  Patient is also having tingling in her fingers and carpal spasms.  She feels very dehydrated.  Patient denies any diarrhea.  No fevers or chills.  Previous records reviewed.  Patient has had frequent bouts of intractable nausea and vomiting.  She has been diagnosed with hyperemesis syndrome.  Patient however has not had an ED visit in our system for over 6 months for this issue.  Past Medical History:  Diagnosis Date  . Hyperemesis     Patient Active Problem List   Diagnosis Date Noted  . Hyperemesis 06/02/2016    Past Surgical History:  Procedure Laterality Date  . CESAREAN SECTION    . INDUCED ABORTION       OB History    Gravida  4   Para  2   Term      Preterm  2   AB  1   Living  2     SAB      TAB  1   Ectopic      Multiple      Live Births              No family history on file.  Social History   Tobacco Use  . Smoking status: Former Smoker    Types: Cigarettes  . Smokeless tobacco: Never Used  Substance Use Topics  . Alcohol use: No  . Drug use: Yes    Types: Marijuana    Home Medications Prior to Admission medications   Medication Sig Start Date End Date Taking? Authorizing Provider  famotidine (PEPCID) 20 MG tablet Take 1 tablet (20 mg total) by mouth 2 (two) times daily. 10/12/19   Carlisle Cater,  PA-C  ondansetron (ZOFRAN ODT) 4 MG disintegrating tablet Take 1 tablet (4 mg total) by mouth every 8 (eight) hours as needed for nausea or vomiting. 10/12/19   Carlisle Cater, PA-C  promethazine (PHENERGAN) 25 MG tablet Take 1 tablet (25 mg total) by mouth every 6 (six) hours as needed for up to 15 doses for nausea or vomiting. 09/09/19 10/12/19  Lennice Sites, DO    Allergies    Patient has no known allergies.  Review of Systems   Review of Systems  All other systems reviewed and are negative.   Physical Exam Updated Vital Signs BP 126/90 (BP Location: Right Arm)   Pulse 74   Temp 99.1 F (37.3 C) (Oral)   Resp 18   Ht 1.651 m (5\' 5" )   Wt 82.1 kg   LMP 03/15/2020   SpO2 100%   BMI 30.12 kg/m   Physical Exam Vitals and nursing note reviewed.  Constitutional:      General: She is in acute distress.     Appearance: She is well-developed. She is ill-appearing.     Comments: Patient rolling around on the  bed, will not lie on her back  HENT:     Head: Normocephalic and atraumatic.     Right Ear: External ear normal.     Left Ear: External ear normal.  Eyes:     General: No scleral icterus.       Right eye: No discharge.        Left eye: No discharge.     Conjunctiva/sclera: Conjunctivae normal.  Neck:     Trachea: No tracheal deviation.  Cardiovascular:     Rate and Rhythm: Normal rate and regular rhythm.  Pulmonary:     Effort: Pulmonary effort is normal. No respiratory distress.     Breath sounds: Normal breath sounds. No stridor. No wheezing or rales.  Abdominal:     General: Bowel sounds are normal. There is no distension.     Palpations: Abdomen is soft.     Tenderness: There is no abdominal tenderness. There is no guarding or rebound.  Musculoskeletal:        General: No tenderness.     Cervical back: Neck supple.     Comments: Carpal spasms noted  Skin:    General: Skin is warm and dry.     Findings: No rash.  Neurological:     Mental Status: She is  alert.     Cranial Nerves: No cranial nerve deficit (no facial droop, extraocular movements intact, no slurred speech).     Sensory: No sensory deficit.     Motor: No abnormal muscle tone or seizure activity.     Coordination: Coordination normal.     ED Results / Procedures / Treatments   Labs (all labs ordered are listed, but only abnormal results are displayed) Labs Reviewed  COMPREHENSIVE METABOLIC PANEL  LIPASE, BLOOD  CBC WITH DIFFERENTIAL/PLATELET  URINALYSIS, ROUTINE W REFLEX MICROSCOPIC  PREGNANCY, URINE    EKG None  Radiology No results found.  Procedures Procedures (including critical care time)  Medications Ordered in ED Medications  sodium chloride 0.9 % bolus 1,000 mL (has no administration in time range)    And  sodium chloride 0.9 % bolus 1,000 mL (has no administration in time range)    And  0.9 %  sodium chloride infusion (has no administration in time range)  ondansetron (ZOFRAN) injection 4 mg (has no administration in time range)  haloperidol lactate (HALDOL) injection 2 mg (has no administration in time range)    ED Course  I have reviewed the triage vital signs and the nursing notes.  Pertinent labs & imaging results that were available during my care of the patient were reviewed by me and considered in my medical decision making (see chart for details).  Clinical Course as of Apr 18 831  Wed Apr 16, 2020  1432 Elevated white blood cell count noted  CBC with Diff(!) [JK]  1459 Bicarb decreased.  Anion gap within normal limits  Comprehensive metabolic panel(!) [JK]  1459 We will proceed with CT scans considering patient's severe pain and elevated white blood cell count   [JK]    Clinical Course User Index [JK] Linwood Dibbles, MD   MDM Rules/Calculators/A&P                      Pt presented with diffuse abd pain.  Hx of recurrent episodes, concerning for hyperemesis syndrome previously.  Pt treated with antiemetics, haldol, ativan with  improvement.  Noted to have significant elevation in wbc.  With her diffuse abd pain, ct scan ordered  for further evaluation.  Case turned over to Dr Deretha Emory Final Clinical Impression(s) / ED Diagnoses pending   Linwood Dibbles, MD 04/17/20 [phone]

## 2020-04-16 NOTE — ED Notes (Signed)
Pt returned from CT °

## 2020-04-16 NOTE — ED Triage Notes (Signed)
Pt c/o abd cramping with n/v x 2 days

## 2021-03-27 ENCOUNTER — Encounter (HOSPITAL_BASED_OUTPATIENT_CLINIC_OR_DEPARTMENT_OTHER): Payer: Self-pay | Admitting: Emergency Medicine

## 2021-03-27 ENCOUNTER — Emergency Department (HOSPITAL_BASED_OUTPATIENT_CLINIC_OR_DEPARTMENT_OTHER)
Admission: EM | Admit: 2021-03-27 | Discharge: 2021-03-27 | Disposition: A | Discharge status: Home / Self Care | Payer: Medicaid Other | Attending: Emergency Medicine | Admitting: Emergency Medicine

## 2021-03-27 ENCOUNTER — Other Ambulatory Visit: Payer: Self-pay

## 2021-03-27 DIAGNOSIS — R1084 Generalized abdominal pain: Secondary | ICD-10-CM

## 2021-03-27 DIAGNOSIS — K920 Hematemesis: Secondary | ICD-10-CM | POA: Insufficient documentation

## 2021-03-27 DIAGNOSIS — R112 Nausea with vomiting, unspecified: Secondary | ICD-10-CM

## 2021-03-27 DIAGNOSIS — Z87891 Personal history of nicotine dependence: Secondary | ICD-10-CM | POA: Diagnosis not present

## 2021-03-27 LAB — COMPREHENSIVE METABOLIC PANEL
ALT: 19 U/L (ref 0–44)
AST: 28 U/L (ref 15–41)
Albumin: 4.7 g/dL (ref 3.5–5.0)
Alkaline Phosphatase: 45 U/L (ref 38–126)
Anion gap: 12 (ref 5–15)
BUN: 14 mg/dL (ref 6–20)
CO2: 21 mmol/L — ABNORMAL LOW (ref 22–32)
Calcium: 9.9 mg/dL (ref 8.9–10.3)
Chloride: 104 mmol/L (ref 98–111)
Creatinine, Ser: 0.83 mg/dL (ref 0.44–1.00)
GFR, Estimated: >60 mL/min (ref >60)
Glucose, Bld: 111 mg/dL — ABNORMAL HIGH (ref 70–99)
Potassium: 3.3 mmol/L — ABNORMAL LOW (ref 3.5–5.1)
Sodium: 137 mmol/L (ref 135–145)
Total Bilirubin: 0.7 mg/dL (ref 0.3–1.2)
Total Protein: 8.6 g/dL — ABNORMAL HIGH (ref 6.5–8.1)

## 2021-03-27 LAB — CBC WITH DIFFERENTIAL/PLATELET
Abs Immature Granulocytes: 0.05 10*3/uL (ref 0.00–0.07)
Basophils Absolute: 0 10*3/uL (ref 0.0–0.1)
Basophils Relative: 0 %
Eosinophils Absolute: 0 10*3/uL (ref 0.0–0.5)
Eosinophils Relative: 0 %
HCT: 39.7 % (ref 36.0–46.0)
Hemoglobin: 13.7 g/dL (ref 12.0–15.0)
Immature Granulocytes: 0 %
Lymphocytes Relative: 18 %
Lymphs Abs: 2.6 10*3/uL (ref 0.7–4.0)
MCH: 27 pg (ref 26.0–34.0)
MCHC: 34.5 g/dL (ref 30.0–36.0)
MCV: 78.3 fL — ABNORMAL LOW (ref 80.0–100.0)
Monocytes Absolute: 0.7 10*3/uL (ref 0.1–1.0)
Monocytes Relative: 5 %
Neutro Abs: 10.6 10*3/uL — ABNORMAL HIGH (ref 1.7–7.7)
Neutrophils Relative %: 77 %
Platelets: 325 10*3/uL (ref 150–400)
RBC: 5.07 MIL/uL (ref 3.87–5.11)
RDW: 13 % (ref 11.5–15.5)
WBC: 14 10*3/uL — ABNORMAL HIGH (ref 4.0–10.5)
nRBC: 0 % (ref 0.0–0.2)

## 2021-03-27 LAB — URINALYSIS, ROUTINE W REFLEX MICROSCOPIC
Glucose, UA: NEGATIVE mg/dL
Ketones, ur: 40 mg/dL — AB
Nitrite: NEGATIVE
Protein, ur: 100 mg/dL — AB
Specific Gravity, Urine: 1.025 (ref 1.005–1.030)
pH: 6.5 (ref 5.0–8.0)

## 2021-03-27 LAB — URINALYSIS, MICROSCOPIC (REFLEX)

## 2021-03-27 LAB — PREGNANCY, URINE: Preg Test, Ur: NEGATIVE

## 2021-03-27 MED ORDER — SODIUM CHLORIDE 0.9 % IV BOLUS
1000.0000 mL | Freq: Once | INTRAVENOUS | Status: AC
  Administered 2021-03-27: 1000 mL via INTRAVENOUS

## 2021-03-27 MED ORDER — DROPERIDOL 2.5 MG/ML IJ SOLN
2.5000 mg | Freq: Once | INTRAMUSCULAR | Status: AC
  Administered 2021-03-27: 2.5 mg via INTRAVENOUS
  Filled 2021-03-27: qty 2

## 2021-03-27 NOTE — ED Triage Notes (Addendum)
Pt having abdominal pain with cramping, N/V.  Started on Tuesday.  Pt states she had unprotected sex over the weekend.  Pt admits to taking more phenergan than prescribed.  Pt seen at [facility] for same yesterday.  Pt admits to cannabis use.

## 2021-03-27 NOTE — ED Notes (Signed)
Pt was moving back and forth the whole time the monitor was taking her BP

## 2021-03-27 NOTE — ED Provider Notes (Signed)
[facility] EMERGENCY DEPARTMENT Provider Note   CSN: [id number] Arrival date & time: 03/27/21  1749     History Chief Complaint  Patient presents with  . Abdominal Pain    [facility] is a 30 y.o. female.  She is here with a complaint of nausea vomiting and abdominal pain.  She is has been going on for a few days.  She was seen at [facility] yesterday for same had an elevated white count, discharged with Phenergan.  She has been vomiting up the Phenergan.  She has a history of similar presentations to the ED.  She does not know why this happens although she thinks it is related to having sex.  She is not having any vaginal bleeding or discharge, no urinary symptoms.  She said she is vomiting so much that she started to see streaks of blood in it  The history is provided by the patient.  Abdominal Pain Pain location:  Generalized Pain quality: cramping   Pain radiates to:  Does not radiate Pain severity:  Severe Onset quality:  Gradual Timing:  Intermittent Progression:  Unchanged Chronicity:  Recurrent Context: alcohol use and recent sexual activity   Context: not trauma   Relieved by:  Nothing Worsened by:  Nothing Ineffective treatments: phenergan. Associated symptoms: hematemesis, nausea and vomiting   Associated symptoms: no chest pain, no constipation, no cough, no diarrhea, no dysuria, no fever, no hematochezia, no hematuria, no shortness of breath, no sore throat, no vaginal bleeding and no vaginal discharge        Past Medical History:  Diagnosis Date  . Cannabis abuse   . Hyperemesis     Patient Active Problem List   Diagnosis Date Noted  . Hyperemesis 06/02/2016    Past Surgical History:  Procedure Laterality Date  . CESAREAN SECTION    . INDUCED ABORTION       OB History    Gravida  4   Para  2   Term      Preterm  2   AB  1   Living  2     SAB      IAB  1   Ectopic      Multiple      Live Births               No family history on file.  Social History   Tobacco Use  . Smoking status: Former Smoker    Types: Cigarettes  . Smokeless tobacco: Never Used  Vaping Use  . Vaping Use: Never used  Substance Use Topics  . Alcohol use: No  . Drug use: Yes    Types: Marijuana    Home Medications Prior to Admission medications   Medication Sig Start Date End Date Taking? Authorizing Provider  ciprofloxacin (CIPRO) 500 MG tablet Take 1 tablet (500 mg total) by mouth 2 (two) times daily. 04/16/20   Vanetta Mulders, MD  famotidine (PEPCID) 20 MG tablet Take 1 tablet (20 mg total) by mouth 2 (two) times daily. 10/12/19   Renne Crigler, PA-C  ondansetron (ZOFRAN ODT) 4 MG disintegrating tablet Take 1 tablet (4 mg total) by mouth every 8 (eight) hours as needed for nausea or vomiting. 10/12/19   Renne Crigler, PA-C  promethazine (PHENERGAN) 25 MG tablet Take 1 tablet (25 mg total) by mouth every 6 (six) hours as needed for up to 15 doses for nausea or vomiting. 09/09/19 10/12/19  Virgina Norfolk, DO    Allergies  Metoclopramide  Review of Systems   Review of Systems  Constitutional: Negative for fever.  HENT: Negative for sore throat.   Eyes: Negative for visual disturbance.  Respiratory: Negative for cough and shortness of breath.   Cardiovascular: Negative for chest pain.  Gastrointestinal: Positive for abdominal pain, hematemesis, nausea and vomiting. Negative for constipation, diarrhea and hematochezia.  Genitourinary: Negative for dysuria, hematuria, vaginal bleeding and vaginal discharge.  Musculoskeletal: Negative for gait problem.  Skin: Negative for rash.  Neurological: Negative for headaches.    Physical Exam Updated Vital Signs BP (!) 140/121 (BP Location: Right Arm)   Pulse 74   Temp 99.2 F (37.3 C) (Oral)   Resp 20   LMP 02/16/2021   SpO2 100%   Physical Exam Vitals and nursing note reviewed.  Constitutional:      General: She is in acute distress (Rocking in  the bed in pain).     Appearance: Normal appearance. She is well-developed.  HENT:     Head: Normocephalic and atraumatic.  Eyes:     Conjunctiva/sclera: Conjunctivae normal.  Cardiovascular:     Rate and Rhythm: Normal rate and regular rhythm.     Heart sounds: No murmur heard.   Pulmonary:     Effort: Pulmonary effort is normal. No respiratory distress.     Breath sounds: Normal breath sounds. No stridor. No wheezing.  Abdominal:     Palpations: Abdomen is soft.     Tenderness: There is no abdominal tenderness. There is no guarding or rebound.  Musculoskeletal:        General: No tenderness. Normal range of motion.     Cervical back: Neck supple.  Skin:    General: Skin is warm and dry.  Neurological:     General: No focal deficit present.     Mental Status: She is alert.     GCS: GCS eye subscore is 4. GCS verbal subscore is 5. GCS motor subscore is 6.     ED Results / Procedures / Treatments   Labs (all labs ordered are listed, but only abnormal results are displayed) Labs Reviewed  COMPREHENSIVE METABOLIC PANEL - Abnormal; Notable for the following components:      Result Value   Potassium 3.3 (*)    CO2 21 (*)    Glucose, Bld 111 (*)    Total Protein 8.6 (*)    All other components within normal limits  CBC WITH DIFFERENTIAL/PLATELET - Abnormal; Notable for the following components:   WBC 14.0 (*)    MCV 78.3 (*)    Neutro Abs 10.6 (*)    All other components within normal limits  URINALYSIS, ROUTINE W REFLEX MICROSCOPIC - Abnormal; Notable for the following components:   Color, Urine AMBER (*)    APPearance CLOUDY (*)    Hgb urine dipstick SMALL (*)    Bilirubin Urine SMALL (*)    Ketones, ur 40 (*)    Protein, ur 100 (*)    Leukocytes,Ua TRACE (*)    All other components within normal limits  URINALYSIS, MICROSCOPIC (REFLEX) - Abnormal; Notable for the following components:   Bacteria, UA MANY (*)    All other components within normal limits   PREGNANCY, URINE    EKG EKG Interpretation  Date/Time:  Friday March 27 2021 07:44:26 EDT Ventricular Rate:  75 PR Interval:    QRS Duration: 82 QT Interval:  386 QTC Calculation: 432 R Axis:   62 Text Interpretation: Normal sinus rhythm No old tracing to compare  Confirmed by Meridee Score [phone]) on 03/27/2021 8:23:36 AM   Radiology No results found.  Procedures Procedures   Medications Ordered in ED Medications  droperidol (INAPSINE) 2.5 MG/ML injection 2.5 mg (2.5 mg Intravenous Given 03/27/21 0807)  sodium chloride 0.9 % bolus 1,000 mL (0 mLs Intravenous Stopped 03/27/21 0932)    ED Course  I have reviewed the triage vital signs and the nursing notes.  Pertinent labs & imaging results that were available during my care of the patient were reviewed by me and considered in my medical decision making (see chart for details).  Clinical Course as of 03/27/21 1806  Fri Mar 27, 2021  0805 Reviewed note from [facility] from yesterday's visit.  She had an elevated white count then and they comment that she often does with her presentations.  They also make note of multiple CT abdomens in the past with negative results other than an episode of colitis. [MB]  0818 Urinalysis with 11-20 reds 6-10 whites but 21-50 squamous cells so contaminated sample. [MB]  0957 Reassessed patient, she said her nausea and vomiting are better.  She asked when she can get the VD shot in her butt.  I offered to do a pelvic exam so we can do some testing but she declined that. [MB]    Clinical Course User Index [MB] Terrilee Files, MD   MDM Rules/Calculators/A&P                         This patient complains of recurrent abdominal pain nausea vomiting; this involves an extensive number of treatment Options and is a complaint that carries with it a high risk of complications and Morbidity. The differential includes cannabis hyperemesis, gastritis, peptic ulcer disease, obstruction,  cholelithiasis, cholecystitis  I ordered, reviewed and interpreted labs, which included CBC with elevated white count, normal hemoglobin, chemistries fairly normal other than mildly low potassium and chloride, normal LFTs, urinalysis likely contaminated, pregnancy test negative I ordered medication IV droperidol and IV fluids with improvement in her symptoms Previous records obtained and reviewed in epic, multiple ED visits for similar presentations.  Negative imaging in the past.  After the interventions stated above, I reevaluated the patient and found patient symptoms to be improved.  She declines pelvic exam.  Recommended follow-up with primary care and return instructions Discussed.  Final Clinical Impression(s) / ED Diagnoses Final diagnoses:  Generalized abdominal pain  Non-intractable vomiting with nausea, unspecified vomiting type    Rx / DC Orders ED Discharge Orders    None       Terrilee Files, MD 03/27/21 1807

## 2021-03-27 NOTE — Discharge Instructions (Signed)
You were seen in the emergency room for nausea and vomiting.  You had lab work done that did not show any significant abnormalities.  Your symptoms improved with some medication.  Your symptoms may be related to marijuana and you should consider stopping.  Return to the emergency department if any worsening or concerning symptoms.

## 2021-04-01 ENCOUNTER — Encounter (HOSPITAL_BASED_OUTPATIENT_CLINIC_OR_DEPARTMENT_OTHER): Payer: Self-pay

## 2021-04-01 ENCOUNTER — Other Ambulatory Visit: Payer: Self-pay

## 2021-04-01 ENCOUNTER — Emergency Department (HOSPITAL_BASED_OUTPATIENT_CLINIC_OR_DEPARTMENT_OTHER): Payer: Medicaid Other

## 2021-04-01 ENCOUNTER — Emergency Department (HOSPITAL_BASED_OUTPATIENT_CLINIC_OR_DEPARTMENT_OTHER)
Admission: EM | Admit: 2021-04-01 | Discharge: 2021-04-01 | Disposition: A | Discharge status: Home / Self Care | Payer: Medicaid Other | Attending: Emergency Medicine | Admitting: Emergency Medicine

## 2021-04-01 DIAGNOSIS — K529 Noninfective gastroenteritis and colitis, unspecified: Secondary | ICD-10-CM | POA: Diagnosis not present

## 2021-04-01 DIAGNOSIS — R1013 Epigastric pain: Secondary | ICD-10-CM | POA: Diagnosis present

## 2021-04-01 DIAGNOSIS — R112 Nausea with vomiting, unspecified: Secondary | ICD-10-CM

## 2021-04-01 DIAGNOSIS — Z87891 Personal history of nicotine dependence: Secondary | ICD-10-CM | POA: Diagnosis not present

## 2021-04-01 DIAGNOSIS — R1084 Generalized abdominal pain: Secondary | ICD-10-CM

## 2021-04-01 LAB — CBC WITH DIFFERENTIAL/PLATELET
Abs Immature Granulocytes: 0.03 10*3/uL (ref 0.00–0.07)
Basophils Absolute: 0 10*3/uL (ref 0.0–0.1)
Basophils Relative: 0 %
Eosinophils Absolute: 0 10*3/uL (ref 0.0–0.5)
Eosinophils Relative: 0 %
HCT: 42.8 % (ref 36.0–46.0)
Hemoglobin: 15.5 g/dL — ABNORMAL HIGH (ref 12.0–15.0)
Immature Granulocytes: 0 %
Lymphocytes Relative: 28 %
Lymphs Abs: 3.1 10*3/uL (ref 0.7–4.0)
MCH: 27.5 pg (ref 26.0–34.0)
MCHC: 36.2 g/dL — ABNORMAL HIGH (ref 30.0–36.0)
MCV: 75.9 fL — ABNORMAL LOW (ref 80.0–100.0)
Monocytes Absolute: 0.7 10*3/uL (ref 0.1–1.0)
Monocytes Relative: 6 %
Neutro Abs: 7.2 10*3/uL (ref 1.7–7.7)
Neutrophils Relative %: 66 %
Platelets: 346 10*3/uL (ref 150–400)
RBC: 5.64 MIL/uL — ABNORMAL HIGH (ref 3.87–5.11)
RDW: 13.1 % (ref 11.5–15.5)
WBC: 11.1 10*3/uL — ABNORMAL HIGH (ref 4.0–10.5)
nRBC: 0 % (ref 0.0–0.2)

## 2021-04-01 LAB — COMPREHENSIVE METABOLIC PANEL
ALT: 31 U/L (ref 0–44)
AST: 26 U/L (ref 15–41)
Albumin: 4.7 g/dL (ref 3.5–5.0)
Alkaline Phosphatase: 48 U/L (ref 38–126)
Anion gap: 15 (ref 5–15)
BUN: 16 mg/dL (ref 6–20)
CO2: 19 mmol/L — ABNORMAL LOW (ref 22–32)
Calcium: 9.7 mg/dL (ref 8.9–10.3)
Chloride: 99 mmol/L (ref 98–111)
Creatinine, Ser: 0.87 mg/dL (ref 0.44–1.00)
GFR, Estimated: >60 mL/min (ref >60)
Glucose, Bld: 124 mg/dL — ABNORMAL HIGH (ref 70–99)
Potassium: 2.9 mmol/L — ABNORMAL LOW (ref 3.5–5.1)
Sodium: 133 mmol/L — ABNORMAL LOW (ref 135–145)
Total Bilirubin: 1.3 mg/dL — ABNORMAL HIGH (ref 0.3–1.2)
Total Protein: 8.4 g/dL — ABNORMAL HIGH (ref 6.5–8.1)

## 2021-04-01 LAB — LIPASE, BLOOD: Lipase: 27 U/L (ref 11–51)

## 2021-04-01 MED ORDER — LACTATED RINGERS IV BOLUS
1000.0000 mL | Freq: Once | INTRAVENOUS | Status: AC
  Administered 2021-04-01: 1000 mL via INTRAVENOUS

## 2021-04-01 MED ORDER — PROCHLORPERAZINE MALEATE 10 MG PO TABS: 10.0000 mg | ORAL_TABLET | Freq: Two times a day (BID) | ORAL | 0 refills | Status: AC | PRN

## 2021-04-01 MED ORDER — IOHEXOL 300 MG/ML  SOLN
100.0000 mL | Freq: Once | INTRAMUSCULAR | Status: AC | PRN
  Administered 2021-04-01: 100 mL via INTRAVENOUS

## 2021-04-01 MED ORDER — POTASSIUM CHLORIDE CRYS ER 20 MEQ PO TBCR
40.0000 meq | EXTENDED_RELEASE_TABLET | Freq: Once | ORAL | Status: AC
  Administered 2021-04-01: 40 meq via ORAL
  Filled 2021-04-01: qty 2

## 2021-04-01 MED ORDER — DICYCLOMINE HCL 20 MG PO TABS: 20.0000 mg | ORAL_TABLET | Freq: Two times a day (BID) | ORAL | 0 refills | Status: AC | PRN

## 2021-04-01 MED ORDER — HALOPERIDOL LACTATE 5 MG/ML IJ SOLN
4.0000 mg | Freq: Once | INTRAMUSCULAR | Status: AC
  Administered 2021-04-01: 4 mg via INTRAVENOUS
  Filled 2021-04-01: qty 1

## 2021-04-01 MED ORDER — POTASSIUM CHLORIDE CRYS ER 20 MEQ PO TBCR: EXTENDED_RELEASE_TABLET | ORAL | 0 refills | Status: AC

## 2021-04-01 NOTE — ED Notes (Signed)
Patient verbalized understanding of dc instructions, prescriptions, follow up referrals and reasons to return to ER for reevaluation.  

## 2021-04-01 NOTE — ED Notes (Signed)
Patient given water and crackers for PO challenge.

## 2021-04-01 NOTE — ED Provider Notes (Signed)
[facility] EMERGENCY DEPARTMENT Provider Note   CSN: [id number] Arrival date & time: 04/01/21  1109     History Chief Complaint  Patient presents with  . Abdominal Pain  . Vomiting    Laura Duncan is a 30 y.o. female.  HPI      30yo female with history of cannabis use, suspected hyperemesis presents with concern for nausea and vomiting.  Was seen 4/7 at [facility] and 4/8 at our facility for same Symptoms started last Tuesday, epigastric pain and lower abdomen with radiation to back and bilateral flanks.  Nausea and vomiting, more than 5 times per day. Taking zofran, promethazine, hycosamine sulfate.  Not at same time. Trying to keep water down.  Still throwing up despite medications.  No diarrhea. Did take a laxative/mag sulfate, had BM this AM, passing flatus.  No fevers, no urinary symptoms.  Not urinating a lot.  No vaginal bleeding or discharge.  Nothing seems to make it better or worse, persistent.  Did have blood in emesis prior but now just some pink streaks.  Stool with mucus, black for one week.  Has had previous CT showing colitis.  Hx of CS  Past Medical History:  Diagnosis Date  . Cannabis abuse   . Hyperemesis     Patient Active Problem List   Diagnosis Date Noted  . Hyperemesis 06/02/2016    Past Surgical History:  Procedure Laterality Date  . CESAREAN SECTION    . INDUCED ABORTION       OB History    Gravida  4   Para  2   Term      Preterm  2   AB  1   Living  2     SAB      IAB  1   Ectopic      Multiple      Live Births              History reviewed. No pertinent family history.  Social History   Tobacco Use  . Smoking status: Former Smoker    Types: Cigarettes  . Smokeless tobacco: Never Used  Vaping Use  . Vaping Use: Never used  Substance Use Topics  . Alcohol use: No  . Drug use: Yes    Types: Marijuana    Home Medications Prior to Admission medications   Medication Sig Start Date End Date  Taking? Authorizing Provider  dicyclomine (BENTYL) 20 MG tablet Take 1 tablet (20 mg total) by mouth 2 (two) times daily as needed for spasms (abdominal pain). 04/01/21  Yes Alvira Monday, MD  potassium chloride SA (KLOR-CON) 20 MEQ tablet Take 2 tablets ( ) tonight, followed by 1 tablet ( ) daily for 3 days 04/01/21  Yes Alvira Monday, MD  prochlorperazine (COMPAZINE) 10 MG tablet Take 1 tablet (10 mg total) by mouth 2 (two) times daily as needed for nausea or vomiting. 04/01/21  Yes Alvira Monday, MD  ciprofloxacin (CIPRO) 500 MG tablet Take 1 tablet (500 mg total) by mouth 2 (two) times daily. 04/16/20   Vanetta Mulders, MD  famotidine (PEPCID) 20 MG tablet Take 1 tablet (20 mg total) by mouth 2 (two) times daily. 10/12/19   Renne Crigler, PA-C  ondansetron (ZOFRAN ODT) 4 MG disintegrating tablet Take 1 tablet (4 mg total) by mouth every 8 (eight) hours as needed for nausea or vomiting. 10/12/19   Renne Crigler, PA-C  promethazine (PHENERGAN) 25 MG tablet Take 1 tablet (25 mg total) by mouth every 6 (six)  hours as needed for up to 15 doses for nausea or vomiting. 09/09/19 10/12/19  Virgina Norfolk, DO    Allergies    Metoclopramide  Review of Systems   Review of Systems  Constitutional: Negative for fever.  HENT: Negative for sore throat.   Eyes: Negative for visual disturbance.  Respiratory: Negative for cough and shortness of breath.   Cardiovascular: Negative for chest pain.  Gastrointestinal: Positive for abdominal pain, constipation, nausea and vomiting. Negative for diarrhea.  Genitourinary: Positive for decreased urine volume and flank pain. Negative for difficulty urinating.  Musculoskeletal: Positive for back pain. Negative for neck pain.  Skin: Negative for rash.  Neurological: Negative for syncope and headaches.    Physical Exam Updated Vital Signs BP (!) 110/94   Pulse 87   Temp 98.5 F (36.9 C) (Oral)   Resp 18   Ht 5\' 5"  (1.651 m)   Wt 81.6 kg   LMP  03/16/2021   SpO2 100%   BMI 29.95 kg/m   Physical Exam Vitals and nursing note reviewed.  Constitutional:      General: She is in acute distress (pain).     Appearance: She is well-developed. She is not diaphoretic.  HENT:     Head: Normocephalic and atraumatic.  Eyes:     Conjunctiva/sclera: Conjunctivae normal.  Cardiovascular:     Rate and Rhythm: Normal rate and regular rhythm.     Heart sounds: Normal heart sounds. No murmur heard. No friction rub. No gallop.   Pulmonary:     Effort: Pulmonary effort is normal. No respiratory distress.     Breath sounds: Normal breath sounds. No wheezing or rales.  Abdominal:     General: There is no distension.     Palpations: Abdomen is soft.     Tenderness: There is generalized abdominal tenderness. There is no guarding.  Musculoskeletal:        General: No tenderness.     Cervical back: Normal range of motion.  Skin:    General: Skin is warm and dry.     Findings: No erythema or rash.  Neurological:     Mental Status: She is alert and oriented to person, place, and time.     ED Results / Procedures / Treatments   Labs (all labs ordered are listed, but only abnormal results are displayed) Labs Reviewed  CBC WITH DIFFERENTIAL/PLATELET - Abnormal; Notable for the following components:      Result Value   WBC 11.1 (*)    RBC 5.64 (*)    Hemoglobin 15.5 (*)    MCV 75.9 (*)    MCHC 36.2 (*)    All other components within normal limits  COMPREHENSIVE METABOLIC PANEL - Abnormal; Notable for the following components:   Sodium 133 (*)    Potassium 2.9 (*)    CO2 19 (*)    Glucose, Bld 124 (*)    Total Protein 8.4 (*)    Total Bilirubin 1.3 (*)    All other components within normal limits  LIPASE, BLOOD    EKG EKG Interpretation  Date/Time:  Wednesday April 01 2021 12:24:55 EDT Ventricular Rate:  73 PR Interval:  161 QRS Duration: 78 QT Interval:  398 QTC Calculation: 439 R Axis:   72 Text Interpretation: Sinus  rhythm No significant change since last tracing Confirmed by [id number] (Alvira Monday) on 04/01/2021 1:52:00 PM   Radiology CT ABDOMEN PELVIS W CONTRAST  Result Date: 04/01/2021 CLINICAL DATA:  Abdominal cramping with nausea and vomiting for  the past week. Bowel obstruction suspected. EXAM: CT ABDOMEN AND PELVIS WITH CONTRAST TECHNIQUE: Multidetector CT imaging of the abdomen and pelvis was performed using the standard protocol following bolus administration of intravenous contrast. CONTRAST:  100mL OMNIPAQUE IOHEXOL 300 MG/ML  SOLN COMPARISON:  Abdominopelvic CT 04/16/2020. FINDINGS: Lower chest: Clear lung bases. No significant pleural or pericardial effusion. Hepatobiliary: The liver is normal in density without suspicious focal abnormality. No evidence of gallstones, gallbladder wall thickening or biliary dilatation. Pancreas: Unremarkable. No pancreatic ductal dilatation or surrounding inflammatory changes. Spleen: Normal in size without focal abnormality. Adrenals/Urinary Tract: Both adrenal glands appear normal. There are small nonobstructing bilateral renal calculi. No evidence of ureteral calculus, hydronephrosis or perinephric soft tissue stranding. Mild asymmetric dilatation of the right renal pelvis appears stable. The bladder appears unremarkable. Stomach/Bowel: Enteric contrast was administered and has passed to the ileocecal valve. The stomach, small bowel and appendix appear normal without wall thickening or dilatation. There is mild diffuse wall thickening of the cecum and transverse colon, similar to previous study. No surrounding inflammatory changes. The distal colon appears normal. Vascular/Lymphatic: There are no enlarged abdominal or pelvic lymph nodes. No significant vascular findings. Reproductive: The uterus and ovaries appear normal. No adnexal mass. Other: No ascites, focal extraluminal fluid collection or free air. Stable small umbilical hernia containing only fat. Musculoskeletal:  No acute or significant osseous findings. No evidence of sacroiliitis. IMPRESSION: 1. Mild proximal colonic wall thickening, similar to previous study and suggesting chronic colitis. 2. No evidence bowel obstruction.  The appendix appears normal. 3. Nonobstructing bilateral renal calculi. Electronically Signed   By: Carey BullocksWilliam  Veazey M.D.   On: 04/01/2021 13:48    Procedures Procedures   Medications Ordered in ED Medications  lactated ringers bolus 1,000 mL (0 mLs Intravenous Stopped 04/01/21 1255)  haloperidol lactate (HALDOL) injection 4 mg (4 mg Intravenous Given 04/01/21 1144)  iohexol (OMNIPAQUE) 300 MG/ML solution 100 mL (100 mLs Intravenous Contrast Given 04/01/21 1306)  potassium chloride SA (KLOR-CON) CR tablet 40 mEq (40 mEq Oral Given 04/01/21 1443)    ED Course  I have reviewed the triage vital signs and the nursing notes.  Pertinent labs & imaging results that were available during my care of the patient were reviewed by me and considered in my medical decision making (see chart for details).    MDM Rules/Calculators/A&P                           30yo female with history of cannabis use, suspected hyperemesis presents with concern for nausea and vomiting and abdominal pain. DDx includes appendicitis, pancreatitis, cholecystitis, pyelonephritis, nephrolithiasis, diverticulitis, SBO, gastroenteritis, colitis, IBD, cannabinoid hyperemesis PID, ovarian torsion, ectopic pregnancy, and tuboovarian abscess. Pregnancy test negative. UA appears contaminated with squamous cells, no significant leukocytes.  No specific pelvic pain, doubt PID/TOA/torsion.    CT abdomen pelvis shows signs of colitis without other acute abnormalities.  Labs show hypokalemia, mildly decreased bicarb.    Given IV fluid, haldol, po K. Symptoms improved and tolerating po.  Given current improvement, vital signs, feel outpatient management with return precautions appropriate. Given rx for potassium, bentyl, compazine  and recommend outpatient follow up with GI given recurrent colitis r/o IBD, consider cannabinoid hyperemesis, gastritis. Patient discharged in stable condition with understanding of reasons to return.   Final Clinical Impression(s) / ED Diagnoses Final diagnoses:  Nausea and vomiting, intractability of vomiting not specified, unspecified vomiting type  Generalized abdominal pain  Colitis  Rx / DC Orders ED Discharge Orders         Ordered    potassium chloride SA (KLOR-CON) 20 MEQ tablet        04/01/21 1511    prochlorperazine (COMPAZINE) 10 MG tablet  2 times daily PRN        04/01/21 1511    dicyclomine (BENTYL) 20 MG tablet  2 times daily PRN        04/01/21 1511           Alvira Monday, MD 04/01/21 2305

## 2021-04-01 NOTE — ED Triage Notes (Signed)
Pt c/o abdominal cramping, N/V for the past week. Has been seen and prescribed multiple nausea meds that have not been working. Pt anxious during triage, unable to sit still.

## 2021-05-24 ENCOUNTER — Emergency Department (HOSPITAL_BASED_OUTPATIENT_CLINIC_OR_DEPARTMENT_OTHER)
Admission: EM | Admit: 2021-05-24 | Discharge: 2021-05-24 | Disposition: A | Discharge status: Home / Self Care | Payer: Medicaid Other | Attending: Emergency Medicine | Admitting: Emergency Medicine

## 2021-05-24 ENCOUNTER — Encounter (HOSPITAL_BASED_OUTPATIENT_CLINIC_OR_DEPARTMENT_OTHER): Payer: Self-pay | Admitting: *Deleted

## 2021-05-24 ENCOUNTER — Other Ambulatory Visit: Payer: Self-pay

## 2021-05-24 DIAGNOSIS — B9689 Other specified bacterial agents as the cause of diseases classified elsewhere: Secondary | ICD-10-CM | POA: Insufficient documentation

## 2021-05-24 DIAGNOSIS — Z202 Contact with and (suspected) exposure to infections with a predominantly sexual mode of transmission: Secondary | ICD-10-CM | POA: Insufficient documentation

## 2021-05-24 DIAGNOSIS — N760 Acute vaginitis: Secondary | ICD-10-CM | POA: Diagnosis not present

## 2021-05-24 DIAGNOSIS — F17210 Nicotine dependence, cigarettes, uncomplicated: Secondary | ICD-10-CM | POA: Insufficient documentation

## 2021-05-24 DIAGNOSIS — Z711 Person with feared health complaint in whom no diagnosis is made: Secondary | ICD-10-CM

## 2021-05-24 DIAGNOSIS — N898 Other specified noninflammatory disorders of vagina: Secondary | ICD-10-CM | POA: Diagnosis present

## 2021-05-24 LAB — URINALYSIS, MICROSCOPIC (REFLEX)

## 2021-05-24 LAB — URINALYSIS, ROUTINE W REFLEX MICROSCOPIC
Bilirubin Urine: NEGATIVE
Glucose, UA: NEGATIVE mg/dL
Ketones, ur: NEGATIVE mg/dL
Nitrite: NEGATIVE
Protein, ur: NEGATIVE mg/dL
Specific Gravity, Urine: 1.015 (ref 1.005–1.030)
pH: 6.5 (ref 5.0–8.0)

## 2021-05-24 LAB — WET PREP, GENITAL
Sperm: NONE SEEN
Trich, Wet Prep: NONE SEEN
WBC, Wet Prep HPF POC: NONE SEEN
Yeast Wet Prep HPF POC: NONE SEEN

## 2021-05-24 LAB — HIV ANTIBODY (ROUTINE TESTING W REFLEX): HIV Screen 4th Generation wRfx: NONREACTIVE

## 2021-05-24 LAB — PREGNANCY, URINE: Preg Test, Ur: NEGATIVE

## 2021-05-24 MED ORDER — DOXYCYCLINE HYCLATE 100 MG PO CAPS: 100.0000 mg | ORAL_CAPSULE | Freq: Two times a day (BID) | ORAL | 0 refills | Status: DC

## 2021-05-24 MED ORDER — DOXYCYCLINE HYCLATE 100 MG PO TABS
100.0000 mg | ORAL_TABLET | Freq: Once | ORAL | Status: AC
  Administered 2021-05-24: 100 mg via ORAL
  Filled 2021-05-24: qty 1

## 2021-05-24 MED ORDER — DOXYCYCLINE HYCLATE 100 MG PO CAPS: 100.0000 mg | ORAL_CAPSULE | Freq: Two times a day (BID) | ORAL | 0 refills | Status: AC

## 2021-05-24 MED ORDER — METRONIDAZOLE 500 MG PO TABS: 500.0000 mg | ORAL_TABLET | Freq: Two times a day (BID) | ORAL | 0 refills | Status: DC

## 2021-05-24 MED ORDER — CEFTRIAXONE SODIUM 500 MG IJ SOLR
500.0000 mg | Freq: Once | INTRAMUSCULAR | Status: AC
  Administered 2021-05-24: 500 mg via INTRAMUSCULAR
  Filled 2021-05-24: qty 500

## 2021-05-24 MED ORDER — LIDOCAINE HCL (PF) 1 % IJ SOLN
INTRAMUSCULAR | Status: AC
  Filled 2021-05-24: qty 5

## 2021-05-24 NOTE — ED Provider Notes (Signed)
[facility] EMERGENCY DEPARTMENT Provider Note   CSN: [id number] Arrival date & time: 05/24/21  1526     History Chief Complaint  Patient presents with  . Vaginal Discharge    Laura Duncan is a 30 y.o. female.  HPI 29 year old female presents with complains of vaginal odor, irritation and increased discharge since Friday. Reports new sexual partner with intercourse on Friday. No barrier method. Denies any dysuria, fevers or chills. Has some mild occasional pelvic discomfort. Reports vaginal odor since Friday, describes it as " fishy". No back pain. She is concerned about possible    Past Medical History:  Diagnosis Date  . Cannabis abuse   . Hyperemesis     Patient Active Problem List   Diagnosis Date Noted  . Hyperemesis 06/02/2016    Past Surgical History:  Procedure Laterality Date  . CESAREAN SECTION    . INDUCED ABORTION       OB History    Gravida  4   Para  2   Term      Preterm  2   AB  1   Living  2     SAB      IAB  1   Ectopic      Multiple      Live Births              No family history on file.  Social History   Tobacco Use  . Smoking status: Current Some Day Smoker    Types: Cigarettes  . Smokeless tobacco: Never Used  Vaping Use  . Vaping Use: Never used  Substance Use Topics  . Alcohol use: Yes    Comment: weekends  . Drug use: Yes    Types: Marijuana    Home Medications Prior to Admission medications   Medication Sig Start Date End Date Taking? Authorizing Provider  doxycycline (VIBRAMYCIN) 100 MG capsule Take 1 capsule (100 mg total) by mouth 2 (two) times daily for 7 days. 05/24/21 05/31/21 Yes Mare Ferrari, PA-C  metroNIDAZOLE (FLAGYL) 500 MG tablet Take 1 tablet (500 mg total) by mouth 2 (two) times daily. 05/24/21  Yes Mare Ferrari, PA-C  ciprofloxacin (CIPRO) 500 MG tablet Take 1 tablet (500 mg total) by mouth 2 (two) times daily. 04/16/20   Vanetta Mulders, MD  dicyclomine (BENTYL) 20 MG  tablet Take 1 tablet (20 mg total) by mouth 2 (two) times daily as needed for spasms (abdominal pain). 04/01/21   Alvira Monday, MD  famotidine (PEPCID) 20 MG tablet Take 1 tablet (20 mg total) by mouth 2 (two) times daily. 10/12/19   Renne Crigler, PA-C  ondansetron (ZOFRAN ODT) 4 MG disintegrating tablet Take 1 tablet (4 mg total) by mouth every 8 (eight) hours as needed for nausea or vomiting. 10/12/19   Renne Crigler, PA-C  potassium chloride SA (KLOR-CON) 20 MEQ tablet Take 2 tablets ( ) tonight, followed by 1 tablet ( ) daily for 3 days 04/01/21   Alvira Monday, MD  prochlorperazine (COMPAZINE) 10 MG tablet Take 1 tablet (10 mg total) by mouth 2 (two) times daily as needed for nausea or vomiting. 04/01/21   Alvira Monday, MD  promethazine (PHENERGAN) 25 MG tablet Take 1 tablet (25 mg total) by mouth every 6 (six) hours as needed for up to 15 doses for nausea or vomiting. 09/09/19 10/12/19  Virgina Norfolk, DO    Allergies    Metoclopramide  Review of Systems   Review of Systems  Constitutional: Negative for  chills, diaphoresis and fever.  Genitourinary: Positive for vaginal discharge. Negative for dysuria, flank pain, pelvic pain, vaginal bleeding and vaginal pain.    Physical Exam Updated Vital Signs BP (!) 134/94 (BP Location: Right Arm)   Pulse 73   Temp 98.2 F (36.8 C) (Oral)   Resp 16   Ht 5\' 5"  (1.651 m)   Wt 81.6 kg   LMP 05/12/2021   SpO2 100%   Breastfeeding No   BMI 29.95 kg/m   Physical Exam Vitals reviewed.  Constitutional:      Appearance: Normal appearance.  HENT:     Head: Normocephalic and atraumatic.  Eyes:     General:        Right eye: No discharge.        Left eye: No discharge.     Extraocular Movements: Extraocular movements intact.     Conjunctiva/sclera: Conjunctivae normal.  Abdominal:     General: Abdomen is flat.     Palpations: Abdomen is soft.     Tenderness: There is no abdominal tenderness. There is no right CVA  tenderness or left CVA tenderness.  Genitourinary:    Vagina: Normal. No tenderness.     Cervix: No cervical motion tenderness, erythema or cervical bleeding.     Uterus: Normal.      Adnexa: Right adnexa normal and left adnexa normal.       Right: No tenderness.         Left: No tenderness.    Musculoskeletal:        General: No swelling. Normal range of motion.  Neurological:     General: No focal deficit present.     Mental Status: She is alert and oriented to person, place, and time.  Psychiatric:        Mood and Affect: Mood normal.        Behavior: Behavior normal.     ED Results / Procedures / Treatments   Labs (all labs ordered are listed, but only abnormal results are displayed) Labs Reviewed  WET PREP, GENITAL - Abnormal; Notable for the following components:      Result Value   Clue Cells Wet Prep HPF POC PRESENT (*)    All other components within normal limits  URINALYSIS, ROUTINE W REFLEX MICROSCOPIC - Abnormal; Notable for the following components:   APPearance HAZY (*)    Hgb urine dipstick MODERATE (*)    Leukocytes,Ua SMALL (*)    All other components within normal limits  URINALYSIS, MICROSCOPIC (REFLEX) - Abnormal; Notable for the following components:   Bacteria, UA MANY (*)    All other components within normal limits  PREGNANCY, URINE  RPR  HIV ANTIBODY (ROUTINE TESTING W REFLEX)  GC/CHLAMYDIA PROBE AMP ([place]) NOT AT [facility]    EKG None  Radiology No results found.  Procedures Procedures   Medications Ordered in ED Medications  lidocaine (PF) (XYLOCAINE) 1 % injection (has no administration in time range)  cefTRIAXone (ROCEPHIN) injection 500 mg (500 mg Intramuscular Given 05/24/21 1838)  doxycycline (VIBRA-TABS) tablet 100 mg (100 mg Oral Given 05/24/21 1837)    ED Course  I have reviewed the triage vital signs and the nursing notes.  Pertinent labs & imaging results that were available during my care of the patient were reviewed by  me and considered in my medical decision making (see chart for details).    MDM Rules/Calculators/A&P  30 year old female presenting with vaginal irritation, odor, increased discharge. New sexual partner with no barrier method. Pelvic exam reassuring, no cervical motion or adnexal tenderness. Vitals reasurring. UA with moderate hemoglobin in UA but no flank tenderness. Some small leukocytes and many bacteria, pt has no urinary symptoms Wet prep with clue cells. GC  Chlamydia pending. Pt would like to be test for HIV and syphillis and treated for gc chlamydia prophylactically. Will treat for BV as well. Pt informed to not drink on the antibiotics. Encouraged to follow up on results via MyChart. Low suspicion for PID, TOA, ovarian torsion. Stable for discharge.  Final Clinical Impression(s) / ED Diagnoses Final diagnoses:  BV (bacterial vaginosis)  Concern about STD in female without diagnosis    Rx / DC Orders ED Discharge Orders         Ordered    doxycycline (VIBRAMYCIN) 100 MG capsule  2 times daily        05/24/21 1742    metroNIDAZOLE (FLAGYL) 500 MG tablet  2 times daily        05/24/21 1821           Mare Ferrari, PA-C 05/24/21 1846    Virgina Norfolk, DO 05/24/21 2325

## 2021-05-24 NOTE — Discharge Instructions (Addendum)
Your wet prep today showed evidence of bacterial vaginosis, this is not an STD.  Please take Flagyl for this as directed until finished.  Do not drink on this medication as it will make you nauseous and vomit.  You will also need to take the doxycycline for treatment of chlamydia, you were treated for gonorrhea in the ER today.  Abstain from sex for 2 weeks, if your results are positive please have all partners tested.  You may also follow-up with the women's clinic or the health department whose contact information provided in your discharge paperwork.  Return to the ER for any new or worsening symptoms peer

## 2021-05-24 NOTE — ED Triage Notes (Signed)
Vaginal d/c, odor, and irritation x 2-3 days

## 2021-05-24 NOTE — ED Notes (Addendum)
Chaperone for EDP Byrd Hesselbach, [place] for pelvic exam. Specimens collected. Pt tolerated well.

## 2021-05-25 LAB — GC/CHLAMYDIA PROBE AMP (~~LOC~~) NOT AT ARMC
Chlamydia: NEGATIVE
Comment: NEGATIVE
Comment: NORMAL
Neisseria Gonorrhea: NEGATIVE

## 2021-05-25 LAB — RPR: RPR Ser Ql: NONREACTIVE

## 2022-01-20 ENCOUNTER — Encounter (HOSPITAL_BASED_OUTPATIENT_CLINIC_OR_DEPARTMENT_OTHER): Payer: Self-pay | Admitting: Emergency Medicine

## 2022-01-20 ENCOUNTER — Emergency Department (HOSPITAL_BASED_OUTPATIENT_CLINIC_OR_DEPARTMENT_OTHER)
Admission: EM | Admit: 2022-01-20 | Discharge: 2022-01-20 | Disposition: A | Discharge status: Home / Self Care | Payer: Medicaid Other | Attending: Emergency Medicine | Admitting: Emergency Medicine

## 2022-01-20 ENCOUNTER — Other Ambulatory Visit: Payer: Self-pay

## 2022-01-20 DIAGNOSIS — N760 Acute vaginitis: Secondary | ICD-10-CM | POA: Diagnosis not present

## 2022-01-20 DIAGNOSIS — B9689 Other specified bacterial agents as the cause of diseases classified elsewhere: Secondary | ICD-10-CM | POA: Diagnosis not present

## 2022-01-20 DIAGNOSIS — Z202 Contact with and (suspected) exposure to infections with a predominantly sexual mode of transmission: Secondary | ICD-10-CM | POA: Insufficient documentation

## 2022-01-20 DIAGNOSIS — N898 Other specified noninflammatory disorders of vagina: Secondary | ICD-10-CM | POA: Diagnosis present

## 2022-01-20 LAB — PREGNANCY, URINE: Preg Test, Ur: NEGATIVE

## 2022-01-20 LAB — URINALYSIS, ROUTINE W REFLEX MICROSCOPIC
Bilirubin Urine: NEGATIVE
Glucose, UA: NEGATIVE mg/dL
Hgb urine dipstick: NEGATIVE
Ketones, ur: NEGATIVE mg/dL
Leukocytes,Ua: NEGATIVE
Nitrite: NEGATIVE
Protein, ur: NEGATIVE mg/dL
Specific Gravity, Urine: >=1.03 (ref 1.005–1.030)
pH: 5.5 (ref 5.0–8.0)

## 2022-01-20 LAB — HIV ANTIBODY (ROUTINE TESTING W REFLEX): HIV Screen 4th Generation wRfx: NONREACTIVE

## 2022-01-20 LAB — WET PREP, GENITAL
Sperm: NONE SEEN
Trich, Wet Prep: NONE SEEN
WBC, Wet Prep HPF POC: <10 (ref <10)
Yeast Wet Prep HPF POC: NONE SEEN

## 2022-01-20 MED ORDER — LIDOCAINE HCL (PF) 1 % IJ SOLN
INTRAMUSCULAR | Status: AC
  Administered 2022-01-20: 2.1 mL
  Filled 2022-01-20: qty 5

## 2022-01-20 MED ORDER — DOXYCYCLINE HYCLATE 100 MG PO CAPS: 100.0000 mg | ORAL_CAPSULE | Freq: Two times a day (BID) | ORAL | 0 refills | Status: DC

## 2022-01-20 MED ORDER — METRONIDAZOLE 500 MG PO TABS: 500.0000 mg | ORAL_TABLET | Freq: Two times a day (BID) | ORAL | 0 refills | Status: AC

## 2022-01-20 MED ORDER — CEFTRIAXONE SODIUM 1 G IJ SOLR
1.0000 g | Freq: Once | INTRAMUSCULAR | Status: AC
  Administered 2022-01-20: 1 g via INTRAMUSCULAR
  Filled 2022-01-20: qty 10

## 2022-01-20 NOTE — ED Notes (Signed)
Pt ambulatory to bathroom to provide urine specimen

## 2022-01-20 NOTE — ED Triage Notes (Signed)
Pt arrives pov with c/o STD exposure. Requests testing incl HIV. Pt ambulatory to triage, steady gait. Pt reports heavy vaginal d/c, lower abdominal cramping

## 2022-01-20 NOTE — Discharge Instructions (Addendum)
Take Flagyl twice daily for the next week.  This should clear up with the bacterial vaginosis.  You were treated today with 1 dose of ceftriaxone, you will need to take doxycycline twice daily for 10 days to fully resolve any gonorrhea or chlamydia symptoms.  Check on MyChart for the results of the HIV blood test.  Information provided above for primary care in the [place] area.

## 2022-01-20 NOTE — ED Provider Notes (Signed)
[facility] EMERGENCY DEPARTMENT Provider Note   CSN: [id number] Arrival date & time: 01/20/22  1308     History  Chief Complaint  Patient presents with   Exposure to STD    Laura Duncan is a 31 y.o. female.   Exposure to STD   Patient with noncontributory medical history presents today with vaginal discharge.  She is concerned about exposure to STD from her recent access issues going to the vaginal recently.  Denies any pelvic pain, some nausea but no vomiting.  Denies any fevers at home, no dyspareunia or vaginal bleeding.  Has not tried anything at home, recent history of BV which was treated fully by Flagyl per patient.  Home Medications Prior to Admission medications   Medication Sig Start Date End Date Taking? Authorizing Provider  doxycycline (VIBRAMYCIN) 100 MG capsule Take 1 capsule (100 mg total) by mouth 2 (two) times daily. 01/20/22  Yes Theron Arista, PA-C  metroNIDAZOLE (FLAGYL) 500 MG tablet Take 1 tablet (500 mg total) by mouth 2 (two) times daily. 01/20/22  Yes Theron Arista, PA-C  dicyclomine (BENTYL) 20 MG tablet Take 1 tablet (20 mg total) by mouth 2 (two) times daily as needed for spasms (abdominal pain). 04/01/21   Alvira Monday, MD  famotidine (PEPCID) 20 MG tablet Take 1 tablet (20 mg total) by mouth 2 (two) times daily. 10/12/19   Renne Crigler, PA-C  ondansetron (ZOFRAN ODT) 4 MG disintegrating tablet Take 1 tablet (4 mg total) by mouth every 8 (eight) hours as needed for nausea or vomiting. 10/12/19   Renne Crigler, PA-C  potassium chloride SA (KLOR-CON) 20 MEQ tablet Take 2 tablets ( ) tonight, followed by 1 tablet ( ) daily for 3 days 04/01/21   Alvira Monday, MD  prochlorperazine (COMPAZINE) 10 MG tablet Take 1 tablet (10 mg total) by mouth 2 (two) times daily as needed for nausea or vomiting. 04/01/21   Alvira Monday, MD  promethazine (PHENERGAN) 25 MG tablet Take 1 tablet (25 mg total) by mouth every 6 (six) hours as needed for up  to 15 doses for nausea or vomiting. 09/09/19 10/12/19  Virgina Norfolk, DO      Allergies    Metoclopramide    Review of Systems   Review of Systems  Genitourinary:  Positive for vaginal discharge.   Physical Exam Updated Vital Signs BP (!) 142/94    Pulse 87    Temp 98.4 F (36.9 C) (Oral)    Resp 18    Ht 5\' 5"  (1.651 m)    Wt 83.9 kg    LMP 12/27/2021    SpO2 100%    BMI 30.79 kg/m  Physical Exam Vitals and nursing note reviewed. Exam conducted with a chaperone present.  Constitutional:      General: She is not in acute distress.    Appearance: Normal appearance.  HENT:     Head: Normocephalic and atraumatic.  Eyes:     General: No scleral icterus.    Extraocular Movements: Extraocular movements intact.     Pupils: Pupils are equal, round, and reactive to light.  Genitourinary:    Exam position: Lithotomy position.     Comments: Opaque white vaginal discharge, no cervical motion tenderness.  No adnexal tenderness. Skin:    Coloration: Skin is not jaundiced.  Neurological:     Mental Status: She is alert. Mental status is at baseline.     Coordination: Coordination normal.    ED Results / Procedures / Treatments   Labs (all labs  ordered are listed, but only abnormal results are displayed) Labs Reviewed  WET PREP, GENITAL - Abnormal; Notable for the following components:      Result Value   Clue Cells Wet Prep HPF POC PRESENT (*)    All other components within normal limits  URINALYSIS, ROUTINE W REFLEX MICROSCOPIC  PREGNANCY, URINE  HIV ANTIBODY (ROUTINE TESTING W REFLEX)  GC/CHLAMYDIA PROBE AMP ([place]) NOT AT [facility]    EKG None  Radiology No results found.  Procedures Procedures    Medications Ordered in ED Medications  cefTRIAXone (ROCEPHIN) injection 1 g (1 g Intramuscular Given 01/20/22 0948)  lidocaine (PF) (XYLOCAINE) 1 % injection (2.1 mLs  Given 01/20/22 8338)    ED Course/ Medical Decision Making/ A&P                           Medical  Decision Making Amount and/or Complexity of Data Reviewed Labs: ordered.  Risk Prescription drug management.   This patient presents to the ED for concern of vaginal discharge, this involves an extensive number of treatment options, and is a complaint that carries with it a high risk of complications and morbidity.  The differential diagnosis includes STD, BV, trichomoniasis, diocese, PID, other   Co morbidities that complicate the patient evaluation: N/A   Additional history obtained: -External records from outside source obtained and reviewed including: Chart review including previous notes, labs, imaging, consultation notes   Lab Tests: -I ordered, reviewed, and interpreted labs.  The pertinent results include:  clue cell.+, negative UA, not pregnant   Medicines ordered and prescription drug management: -I ordered medication including Rocephin for empiric STD treatment -Reevaluation of the patient after these medicines showed that the patient stayed the same -I have reviewed the patients home medicines and have made adjustments as needed    ED Course: 31 year old female with stable vitals and unremarkable PE.  Doubt PID given no cervical motion tenderness, vaginal discharge consistent with positive BV.  Will discharge with Flagyl and doxycycline, she will check online for results of the HIV.  Questions answered, discharged in stable condition   After the interventions noted above, I reevaluated the patient and found that they have :stayed the same   Dispostion: D/C         Final Clinical Impression(s) / ED Diagnoses Final diagnoses:  STD exposure  BV (bacterial vaginosis)    Rx / DC Orders ED Discharge Orders          Ordered    metroNIDAZOLE (FLAGYL) 500 MG tablet  2 times daily        01/20/22 1001    doxycycline (VIBRAMYCIN) 100 MG capsule  2 times daily        01/20/22 1001              Theron Arista, PA-C 01/20/22 1008    Pollyann Savoy, MD 01/21/22 [phone]

## 2022-01-21 LAB — GC/CHLAMYDIA PROBE AMP (~~LOC~~) NOT AT ARMC
Chlamydia: NEGATIVE
Comment: NEGATIVE
Comment: NORMAL
Neisseria Gonorrhea: NEGATIVE

## 2023-01-05 ENCOUNTER — Other Ambulatory Visit: Payer: Self-pay

## 2023-01-05 ENCOUNTER — Emergency Department (HOSPITAL_COMMUNITY): Payer: Self-pay

## 2023-01-05 ENCOUNTER — Emergency Department (HOSPITAL_COMMUNITY)
Admission: EM | Admit: 2023-01-05 | Discharge: 2023-01-06 | Discharge status: Against Medical Advice | Payer: Self-pay | Attending: Emergency Medicine | Admitting: Emergency Medicine

## 2023-01-05 DIAGNOSIS — R059 Cough, unspecified: Secondary | ICD-10-CM | POA: Insufficient documentation

## 2023-01-05 DIAGNOSIS — Z5321 Procedure and treatment not carried out due to patient leaving prior to being seen by health care provider: Secondary | ICD-10-CM | POA: Insufficient documentation

## 2023-01-05 DIAGNOSIS — R112 Nausea with vomiting, unspecified: Secondary | ICD-10-CM | POA: Insufficient documentation

## 2023-01-05 DIAGNOSIS — N898 Other specified noninflammatory disorders of vagina: Secondary | ICD-10-CM | POA: Insufficient documentation

## 2023-01-05 DIAGNOSIS — R1030 Lower abdominal pain, unspecified: Secondary | ICD-10-CM | POA: Insufficient documentation

## 2023-01-05 LAB — URINALYSIS, ROUTINE W REFLEX MICROSCOPIC
Bilirubin Urine: NEGATIVE
Glucose, UA: NEGATIVE mg/dL
Hgb urine dipstick: NEGATIVE
Ketones, ur: NEGATIVE mg/dL
Leukocytes,Ua: NEGATIVE
Nitrite: NEGATIVE
Protein, ur: NEGATIVE mg/dL
Specific Gravity, Urine: >1.03 — ABNORMAL HIGH (ref 1.005–1.030)
pH: 6 (ref 5.0–8.0)

## 2023-01-05 LAB — I-STAT BETA HCG BLOOD, ED (MC, WL, AP ONLY): I-stat hCG, quantitative: <5 m[IU]/mL (ref <5)

## 2023-01-05 LAB — HIV ANTIBODY (ROUTINE TESTING W REFLEX): HIV Screen 4th Generation wRfx: NONREACTIVE

## 2023-01-05 MED ORDER — DOXYCYCLINE HYCLATE 100 MG PO CAPS: 100.0000 mg | ORAL_CAPSULE | Freq: Two times a day (BID) | ORAL | 0 refills | Status: DC

## 2023-01-05 MED ORDER — CEFTRIAXONE SODIUM 500 MG IJ SOLR: 500.0000 mg | Freq: Once | INTRAMUSCULAR | Status: DC

## 2023-01-05 NOTE — ED Triage Notes (Signed)
Pt. Stated, I got this call from this woman who said I need to get checked out for STD. Since Sunday night Ive had abdominal cramping and nausea, and vaginal discharge.
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
## 2023-01-05 NOTE — ED Notes (Signed)
Patient declined to have her vitals taken.

## 2023-01-06 ENCOUNTER — Emergency Department (HOSPITAL_BASED_OUTPATIENT_CLINIC_OR_DEPARTMENT_OTHER)
Admission: EM | Admit: 2023-01-06 | Discharge: 2023-01-06 | Disposition: A | Discharge status: Home / Self Care | Payer: Self-pay | Attending: Emergency Medicine | Admitting: Emergency Medicine

## 2023-01-06 ENCOUNTER — Encounter (HOSPITAL_BASED_OUTPATIENT_CLINIC_OR_DEPARTMENT_OTHER): Payer: Self-pay | Admitting: Emergency Medicine

## 2023-01-06 DIAGNOSIS — N898 Other specified noninflammatory disorders of vagina: Secondary | ICD-10-CM | POA: Insufficient documentation

## 2023-01-06 DIAGNOSIS — Z202 Contact with and (suspected) exposure to infections with a predominantly sexual mode of transmission: Secondary | ICD-10-CM | POA: Insufficient documentation

## 2023-01-06 LAB — WET PREP, GENITAL
Clue Cells Wet Prep HPF POC: NONE SEEN
Sperm: NONE SEEN
Trich, Wet Prep: NONE SEEN
WBC, Wet Prep HPF POC: >=10 — AB (ref <10)
Yeast Wet Prep HPF POC: NONE SEEN

## 2023-01-06 MED ORDER — AZITHROMYCIN 250 MG PO TABS
1000.0000 mg | ORAL_TABLET | Freq: Once | ORAL | Status: AC
  Administered 2023-01-06: 1000 mg via ORAL
  Filled 2023-01-06: qty 4

## 2023-01-06 MED ORDER — CEFTRIAXONE SODIUM 500 MG IJ SOLR
500.0000 mg | Freq: Once | INTRAMUSCULAR | Status: AC
  Administered 2023-01-06: 500 mg via INTRAMUSCULAR
  Filled 2023-01-06: qty 500

## 2023-01-06 MED ORDER — LIDOCAINE HCL (PF) 1 % IJ SOLN
INTRAMUSCULAR | Status: AC
  Filled 2023-01-06: qty 5

## 2023-01-06 NOTE — Discharge Instructions (Signed)
We will call if your cultures indicate you require further treatment or need to take additional action.  Return to the emergency department if symptoms significantly worsen or change.

## 2023-01-06 NOTE — ED Triage Notes (Signed)
Patient arrived via POV c/o exposure to STD. Patient seen previously for same with UA and blood drawn. Patient would like to be tested. Patient states cramping with cloudy gray discharge. Patient is AO x 4, VS WDL, normal gait.
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
## 2023-01-06 NOTE — ED Notes (Addendum)
Pt requesting to be tested for STDs Reports gray discharge and cramping Pt is undressing setting up for a pelvic

## 2023-01-07 LAB — GC/CHLAMYDIA PROBE AMP (~~LOC~~) NOT AT ARMC
Chlamydia: NEGATIVE
Comment: NEGATIVE
Comment: NORMAL
Neisseria Gonorrhea: NEGATIVE

## 2023-07-16 ENCOUNTER — Encounter (HOSPITAL_BASED_OUTPATIENT_CLINIC_OR_DEPARTMENT_OTHER): Payer: Self-pay

## 2023-07-16 ENCOUNTER — Emergency Department (HOSPITAL_BASED_OUTPATIENT_CLINIC_OR_DEPARTMENT_OTHER)
Admission: EM | Admit: 2023-07-16 | Discharge: 2023-07-16 | Disposition: A | Discharge status: Home / Self Care | Payer: Self-pay | Attending: Emergency Medicine | Admitting: Emergency Medicine

## 2023-07-16 ENCOUNTER — Other Ambulatory Visit: Payer: Self-pay

## 2023-07-16 DIAGNOSIS — R112 Nausea with vomiting, unspecified: Secondary | ICD-10-CM | POA: Insufficient documentation

## 2023-07-16 LAB — URINALYSIS, ROUTINE W REFLEX MICROSCOPIC
Glucose, UA: NEGATIVE mg/dL
Ketones, ur: >=80 mg/dL — AB
Leukocytes,Ua: NEGATIVE
Nitrite: NEGATIVE
Protein, ur: 100 mg/dL — AB
Specific Gravity, Urine: >=1.03 (ref 1.005–1.030)
pH: 6 (ref 5.0–8.0)

## 2023-07-16 LAB — CBC
HCT: 40.1 % (ref 36.0–46.0)
Hemoglobin: 13.6 g/dL (ref 12.0–15.0)
MCH: 26.7 pg (ref 26.0–34.0)
MCHC: 33.9 g/dL (ref 30.0–36.0)
MCV: 78.6 fL — ABNORMAL LOW (ref 80.0–100.0)
Platelets: 308 10*3/uL (ref 150–400)
RBC: 5.1 MIL/uL (ref 3.87–5.11)
RDW: 12.6 % (ref 11.5–15.5)
WBC: 13 10*3/uL — ABNORMAL HIGH (ref 4.0–10.5)
nRBC: 0 % (ref 0.0–0.2)

## 2023-07-16 LAB — COMPREHENSIVE METABOLIC PANEL
ALT: 14 U/L (ref 0–44)
AST: 19 U/L (ref 15–41)
Albumin: 5.1 g/dL — ABNORMAL HIGH (ref 3.5–5.0)
Alkaline Phosphatase: 42 U/L (ref 38–126)
Anion gap: 15 (ref 5–15)
BUN: 21 mg/dL — ABNORMAL HIGH (ref 6–20)
CO2: 20 mmol/L — ABNORMAL LOW (ref 22–32)
Calcium: 10.3 mg/dL (ref 8.9–10.3)
Chloride: 105 mmol/L (ref 98–111)
Creatinine, Ser: 0.81 mg/dL (ref 0.44–1.00)
GFR, Estimated: >60 mL/min (ref >60)
Glucose, Bld: 107 mg/dL — ABNORMAL HIGH (ref 70–99)
Potassium: 3.3 mmol/L — ABNORMAL LOW (ref 3.5–5.1)
Sodium: 140 mmol/L (ref 135–145)
Total Bilirubin: 1 mg/dL (ref 0.3–1.2)
Total Protein: 8.6 g/dL — ABNORMAL HIGH (ref 6.5–8.1)

## 2023-07-16 LAB — URINALYSIS, MICROSCOPIC (REFLEX)

## 2023-07-16 LAB — LIPASE, BLOOD: Lipase: 13 U/L (ref 11–51)

## 2023-07-16 LAB — PREGNANCY, URINE: Preg Test, Ur: NEGATIVE

## 2023-07-16 MED ORDER — SODIUM CHLORIDE 0.9 % IV BOLUS
1000.0000 mL | Freq: Once | INTRAVENOUS | Status: AC
  Administered 2023-07-16: 1000 mL via INTRAVENOUS

## 2023-07-16 MED ORDER — PROCHLORPERAZINE EDISYLATE 10 MG/2ML IJ SOLN
10.0000 mg | Freq: Once | INTRAMUSCULAR | Status: AC
  Administered 2023-07-16: 10 mg via INTRAVENOUS
  Filled 2023-07-16: qty 2

## 2023-07-16 MED ORDER — MORPHINE SULFATE (PF) 2 MG/ML IV SOLN
2.0000 mg | Freq: Once | INTRAVENOUS | Status: AC
  Administered 2023-07-16: 2 mg via INTRAVENOUS
  Filled 2023-07-16: qty 1

## 2023-07-16 MED ORDER — ONDANSETRON 4 MG PO TBDP: ORAL_TABLET | ORAL | 0 refills | Status: AC

## 2023-07-16 MED ORDER — DIPHENHYDRAMINE HCL 50 MG/ML IJ SOLN
25.0000 mg | Freq: Once | INTRAMUSCULAR | Status: AC
  Administered 2023-07-16: 25 mg via INTRAVENOUS
  Filled 2023-07-16: qty 1

## 2023-07-16 NOTE — ED Notes (Signed)
Pt given urine specimen cup and instructed to obtain urine specimen whenever feasible. Fluid being given. Pt educated on how to take pump with her to the bathroom in case of emergency episode of diarrhea.

## 2023-07-16 NOTE — ED Provider Notes (Signed)
[place] EMERGENCY DEPARTMENT AT [facility] Provider Note   CSN: [id number] Arrival date & time: 07/16/23  4098     History  Chief Complaint  Patient presents with   Emesis   Abdominal Pain    Laura Duncan is a 32 y.o. female.  32 yo F with a chief plaints of nausea and vomiting.  Going on for about 3 days now.  She took a Dulcolax last night because she had had a bowel movement and had a loose stool this morning otherwise denies diarrhea.  No suspicious food intake no recent travel.  No known sick contacts.  Having some upper abdominal discomfort with this as well.  History of C-section.  Denies likelihood of being pregnant.   Emesis Associated symptoms: abdominal pain   Abdominal Pain Associated symptoms: vomiting        Home Medications Prior to Admission medications   Medication Sig Start Date End Date Taking? Authorizing Provider  ondansetron (ZOFRAN-ODT) 4 MG disintegrating tablet 4mg  ODT q4 hours prn nausea/vomit 07/16/23  Yes Melene Plan, DO  dicyclomine (BENTYL) 20 MG tablet Take 1 tablet (20 mg total) by mouth 2 (two) times daily as needed for spasms (abdominal pain). 04/01/21   Alvira Monday, MD  doxycycline (VIBRAMYCIN) 100 MG capsule Take 1 capsule (100 mg total) by mouth 2 (two) times daily. 01/05/23   Redwine, Madison A, PA-C  famotidine (PEPCID) 20 MG tablet Take 1 tablet (20 mg total) by mouth 2 (two) times daily. 10/12/19   Renne Crigler, PA-C  metroNIDAZOLE (FLAGYL) 500 MG tablet Take 1 tablet (500 mg total) by mouth 2 (two) times daily. 01/20/22   Theron Arista, PA-C  potassium chloride SA (KLOR-CON) 20 MEQ tablet Take 2 tablets ( ) tonight, followed by 1 tablet ( ) daily for 3 days 04/01/21   Alvira Monday, MD  prochlorperazine (COMPAZINE) 10 MG tablet Take 1 tablet (10 mg total) by mouth 2 (two) times daily as needed for nausea or vomiting. 04/01/21   Alvira Monday, MD  promethazine (PHENERGAN) 25 MG tablet Take 1 tablet (25 mg  total) by mouth every 6 (six) hours as needed for up to 15 doses for nausea or vomiting. 09/09/19 10/12/19  Virgina Norfolk, DO      Allergies    Metoclopramide    Review of Systems   Review of Systems  Gastrointestinal:  Positive for abdominal pain and vomiting.    Physical Exam Updated Vital Signs BP (!) 134/93 (BP Location: Left Arm)   Pulse 65   Temp 98.3 F (36.8 C)   Resp 16   Ht 5\' 5"  (1.651 m)   Wt 83.9 kg   LMP 06/20/2023 (Approximate)   SpO2 99%   BMI 30.78 kg/m  Physical Exam Vitals and nursing note reviewed.  Constitutional:      General: She is not in acute distress.    Appearance: She is well-developed. She is not diaphoretic.  HENT:     Head: Normocephalic and atraumatic.  Eyes:     Pupils: Pupils are equal, round, and reactive to light.  Cardiovascular:     Rate and Rhythm: Normal rate and regular rhythm.     Heart sounds: No murmur heard.    No friction rub. No gallop.  Pulmonary:     Effort: Pulmonary effort is normal.     Breath sounds: No wheezing or rales.  Abdominal:     General: There is no distension.     Palpations: Abdomen is soft.  Tenderness: There is no abdominal tenderness.  Musculoskeletal:        General: No tenderness.     Cervical back: Normal range of motion and neck supple.  Skin:    General: Skin is warm and dry.  Neurological:     Mental Status: She is alert and oriented to person, place, and time.  Psychiatric:        Behavior: Behavior normal.     ED Results / Procedures / Treatments   Labs (all labs ordered are listed, but only abnormal results are displayed) Labs Reviewed  COMPREHENSIVE METABOLIC PANEL - Abnormal; Notable for the following components:      Result Value   Potassium 3.3 (*)    CO2 20 (*)    Glucose, Bld 107 (*)    BUN 21 (*)    Total Protein 8.6 (*)    Albumin 5.1 (*)    All other components within normal limits  CBC - Abnormal; Notable for the following components:   WBC 13.0 (*)    MCV  78.6 (*)    All other components within normal limits  URINALYSIS, ROUTINE W REFLEX MICROSCOPIC - Abnormal; Notable for the following components:   Hgb urine dipstick TRACE (*)    Bilirubin Urine MODERATE (*)    Ketones, ur >=80 (*)    Protein, ur 100 (*)    All other components within normal limits  URINALYSIS, MICROSCOPIC (REFLEX) - Abnormal; Notable for the following components:   Bacteria, UA MANY (*)    All other components within normal limits  LIPASE, BLOOD  PREGNANCY, URINE    EKG None  Radiology No results found.  Procedures Procedures    Medications Ordered in ED Medications  prochlorperazine (COMPAZINE) injection 10 mg (10 mg Intravenous Given 07/16/23 1011)  diphenhydrAMINE (BENADRYL) injection 25 mg (25 mg Intravenous Given 07/16/23 1010)  sodium chloride 0.9 % bolus 1,000 mL (0 mLs Intravenous Stopped 07/16/23 1140)  morphine (PF) 2 MG/ML injection 2 mg (2 mg Intravenous Given 07/16/23 1009)    ED Course/ Medical Decision Making/ A&P                             Medical Decision Making Amount and/or Complexity of Data Reviewed Labs: ordered.  Risk Prescription drug management.   32 yo F with a chief complaints of nausea vomiting and upper abdominal discomfort.  This been going on for about 3 days now.  Benign exam.  Will treat pain and nausea.  Blood work.  Reassess.  Pregnancy test is negative.  No significant electrolyte abnormalities.  LFTs and lipase are unremarkable.  UA contaminated, I think unlikely to be infected.  Will hold off on treatment.  Patient is feeling much better on repeat assessment.  Able to tolerate by mouth.  Will discharge home.  PCP follow-up.  1:02 PM:  I have discussed the diagnosis/risks/treatment options with the patient.  Evaluation and diagnostic testing in the emergency department does not suggest an emergent condition requiring admission or immediate intervention beyond what has been performed at this time.  They will follow  up with PCP. We also discussed returning to the ED immediately if new or worsening sx occur. We discussed the sx which are most concerning (e.g., sudden worsening pain, fever, inability to tolerate by mouth) that necessitate immediate return. Medications administered to the patient during their visit and any new prescriptions provided to the patient are listed below.  Medications given  during this visit Medications  prochlorperazine (COMPAZINE) injection 10 mg (10 mg Intravenous Given 07/16/23 1011)  diphenhydrAMINE (BENADRYL) injection 25 mg (25 mg Intravenous Given 07/16/23 1010)  sodium chloride 0.9 % bolus 1,000 mL (0 mLs Intravenous Stopped 07/16/23 1140)  morphine (PF) 2 MG/ML injection 2 mg (2 mg Intravenous Given 07/16/23 1009)     The patient appears reasonably screen and/or stabilized for discharge and I doubt any other medical condition or other [facility] requiring further screening, evaluation, or treatment in the ED at this time prior to discharge.           Final Clinical Impression(s) / ED Diagnoses Final diagnoses:  Nausea and vomiting in adult    Rx / DC Orders ED Discharge Orders          Ordered    ondansetron (ZOFRAN-ODT) 4 MG disintegrating tablet        07/16/23 1259              Melene Plan, DO 07/16/23 1302

## 2023-07-16 NOTE — ED Notes (Signed)
Discharge paperwork reviewed entirely with patient, including follow up care. Pain was under control. The patient received instruction and coaching on their prescriptions, and all follow-up questions were answered.  Pt verbalized understanding as well as all parties involved. No questions or concerns voiced at the time of discharge. No acute distress noted.   Pt ambulated out to PVA without incident or assistance.  

## 2023-07-16 NOTE — ED Triage Notes (Signed)
Patient having been V/D for three days. She is also having ABD pain.

## 2023-07-16 NOTE — ED Notes (Signed)
Cannot discharge, registration in chart.   

## 2023-07-16 NOTE — Discharge Instructions (Addendum)
Everyone needs a family doctor.  There is a family doctor's office in this building if but the information on the paperwork if you like to call them to try and set up an appointment.  There is also a hotline number in this paperwork that he can call to try and set up with a new physician.  As we it is that if the pain worsens you get a fever or unable to eat or drink please return to be evaluated.

## 2023-07-29 ENCOUNTER — Emergency Department (HOSPITAL_BASED_OUTPATIENT_CLINIC_OR_DEPARTMENT_OTHER)
Admission: EM | Admit: 2023-07-29 | Discharge: 2023-07-29 | Disposition: A | Discharge status: Home / Self Care | Payer: Self-pay | Attending: Emergency Medicine | Admitting: Emergency Medicine

## 2023-07-29 ENCOUNTER — Encounter (HOSPITAL_BASED_OUTPATIENT_CLINIC_OR_DEPARTMENT_OTHER): Payer: Self-pay | Admitting: Urology

## 2023-07-29 ENCOUNTER — Other Ambulatory Visit: Payer: Self-pay

## 2023-07-29 DIAGNOSIS — N898 Other specified noninflammatory disorders of vagina: Secondary | ICD-10-CM | POA: Insufficient documentation

## 2023-07-29 DIAGNOSIS — R102 Pelvic and perineal pain: Secondary | ICD-10-CM | POA: Insufficient documentation

## 2023-07-29 DIAGNOSIS — R1030 Lower abdominal pain, unspecified: Secondary | ICD-10-CM | POA: Insufficient documentation

## 2023-07-29 LAB — URINALYSIS, ROUTINE W REFLEX MICROSCOPIC
Bilirubin Urine: NEGATIVE
Glucose, UA: NEGATIVE mg/dL
Hgb urine dipstick: NEGATIVE
Ketones, ur: NEGATIVE mg/dL
Leukocytes,Ua: NEGATIVE
Nitrite: NEGATIVE
Protein, ur: NEGATIVE mg/dL
Specific Gravity, Urine: >=1.03 (ref 1.005–1.030)
pH: 6.5 (ref 5.0–8.0)

## 2023-07-29 LAB — WET PREP, GENITAL
Clue Cells Wet Prep HPF POC: NONE SEEN
Sperm: NONE SEEN
Trich, Wet Prep: NONE SEEN
WBC, Wet Prep HPF POC: <10 (ref <10)
Yeast Wet Prep HPF POC: NONE SEEN

## 2023-07-29 LAB — PREGNANCY, URINE: Preg Test, Ur: NEGATIVE

## 2023-07-29 LAB — HIV ANTIBODY (ROUTINE TESTING W REFLEX): HIV Screen 4th Generation wRfx: NONREACTIVE

## 2023-07-29 MED ORDER — DOXYCYCLINE HYCLATE 100 MG PO CAPS: 100.0000 mg | ORAL_CAPSULE | Freq: Two times a day (BID) | ORAL | 0 refills | Status: AC

## 2023-07-29 MED ORDER — CEFTRIAXONE SODIUM 500 MG IJ SOLR
500.0000 mg | Freq: Once | INTRAMUSCULAR | Status: AC
  Administered 2023-07-29: 500 mg via INTRAMUSCULAR
  Filled 2023-07-29: qty 500

## 2023-07-29 NOTE — ED Notes (Signed)
RN provided AVS using Teachback Method. Patient verbalizes understanding of Discharge Instructions. Opportunity for Questioning and Answers were provided by RN. Patient Discharged from ED ambulatory to Home via Self.  
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
## 2023-07-29 NOTE — Discharge Instructions (Signed)
You have been seen today in the Emergency Department (ED) for pelvic pain and discharge.  You have been treated with a one time dose medication and prescribed a week of antibiotics. Please have your partner tested for STD's and do not resume sexual activity until you and your partner have confirmed negative test results or have both been treated.  Please follow up with your doctor as soon as possible regarding today's ED visit and your symptoms.   Return to the ED if your pain worsens, you develop a fever, or for any other symptoms that concern you.

## 2023-07-29 NOTE — ED Provider Notes (Signed)
Emergency Department Provider Note   I have reviewed the triage vital signs and the nursing notes.   HISTORY  Chief Complaint Abdominal Cramping   HPI Laura Duncan is a 32 y.o. female past history reviewed below presents to the emergency department with cramping lower abdominal pain and vaginal discharge.  She has had multiple unprotected sexual encounters with several partners recently.  Her last menstrual cycle was on July 27.  For the past 2 weeks she has had persistent, thin vaginal discharge and some cramping suprapubic discomfort.  No bleeding.  No dysuria, hesitancy, urgency.  No upper abdominal pain, vomiting, diarrhea. No fever.   Past Medical History:  Diagnosis Date   Cannabis abuse    Hyperemesis     Review of Systems  Constitutional: No fever/chills Cardiovascular: Denies chest pain. Respiratory: Denies shortness of breath. Gastrointestinal: Positive lower abdominal pain.  No nausea, no vomiting.  No diarrhea.  No constipation. Genitourinary: Negative for dysuria. Positive vaginal discharge.  Musculoskeletal: Negative for back pain. Skin: Negative for rash. Neurological: Negative for headaches. ____________________________________________   PHYSICAL EXAM:  VITAL SIGNS: ED Triage Vitals  Encounter Vitals Group     BP 07/29/23 1604 (!) 156/106     Pulse Rate 07/29/23 1604 66     Resp 07/29/23 1604 18     Temp 07/29/23 1604 99.1 F (37.3 C)     Temp Source 07/29/23 1604 Oral     SpO2 07/29/23 1604 100 %     Weight 07/29/23 1602 180 lb (81.6 kg)     Height 07/29/23 1602 5\' 5"  (1.651 m)   Constitutional: Alert and oriented. Well appearing and in no acute distress. Eyes: Conjunctivae are normal. Head: Atraumatic. Nose: No congestion/rhinnorhea. Mouth/Throat: Mucous membranes are moist.  Neck: No stridor.  Cardiovascular: Normal rate, regular rhythm. Good peripheral circulation. Grossly normal heart sounds.   Respiratory: Normal respiratory effort.   No retractions. Lungs CTAB. Gastrointestinal: Soft and nontender. No distention.  Genitourinary: Exam performed with patient's verbal consent and with nurse tech chaperone present.  Normal external genitalia.  No copious or foul-smelling discharge.  No vaginal bleeding.  Musculoskeletal: No lower extremity tenderness nor edema. No gross deformities of extremities. Neurologic:  Normal speech and language.  Skin:  Skin is warm, dry and intact. No rash noted.   ____________________________________________   LABS (all labs ordered are listed, but only abnormal results are displayed)  Labs Reviewed  WET PREP, GENITAL  PREGNANCY, URINE  URINALYSIS, ROUTINE W REFLEX MICROSCOPIC  HIV ANTIBODY (ROUTINE TESTING W REFLEX)  RPR  GC/CHLAMYDIA PROBE AMP ([place]) NOT AT [facility]    ____________________________________________   PROCEDURES  Procedure(s) performed:   Procedures  None  ____________________________________________   INITIAL IMPRESSION / ASSESSMENT AND PLAN / ED COURSE  Pertinent labs & imaging results that were available during my care of the patient were reviewed by me and considered in my medical decision making (see chart for details).   This patient is Presenting for Evaluation of abdominal pain, which does require a range of treatment options, and is a complaint that involves a high risk of morbidity and mortality.  The Differential Diagnoses includes but is not exclusive to ectopic pregnancy, ovarian cyst, ovarian torsion, acute appendicitis, urinary tract infection, endometriosis, bowel obstruction, hernia, colitis, renal colic, gastroenteritis, volvulus etc.  I decided to review pertinent External Data, and in summary patient with prior ED visits for similar in the last several years.    Clinical Laboratory Tests Ordered, included wet prep  negative for BV or Trichomonas.  Pregnancy negative.  UA without infection.   Radiologic Tests: Consider pelvic ultrasound  but clinically exceedingly low suspicion for ovarian torsion.   Medical Decision Making: Summary:  Patient presents emergency department with cramping lower abdominal pain and vaginal discharge in the setting of unprotected sexual encounters recently.  On exam, clinically very low suspicion for PID.  Physiologic discharge appreciated on exam.  Will send STI screening labs along with wet prep.  Had also sent HIV and RPR but results will not during this ED stay.  We discussed that given the relatively short time since her unprotected sexual encounters she will need to be retested in 4 to 6 weeks through either her Gyn or health dept.   Reevaluation with update and discussion with patient.  We discussed empiric treatment for STI versus waiting for results from the additional swabs and blood tests.  Given recent unprotected sex multiple partners move for empiric treatment.  Patient to follow the remaining results in the MyChart app.   Patient's presentation is most consistent with acute, uncomplicated illness.   Disposition: discharge  ____________________________________________  FINAL CLINICAL IMPRESSION(S) / ED DIAGNOSES  Final diagnoses:  Pelvic pain in female    NEW OUTPATIENT MEDICATIONS STARTED DURING THIS VISIT:  New Prescriptions   DOXYCYCLINE (VIBRAMYCIN) 100 MG CAPSULE    Take 1 capsule (100 mg total) by mouth 2 (two) times daily for 7 days.    Note:  This document was prepared using Dragon voice recognition software and may include unintentional dictation errors.  Alona Bene, MD, [facility]    Rylin Seavey, Arlyss Repress, MD 07/29/23 [phone]
